# Patient Record
Sex: Female | Born: 1965 | Race: White | Hispanic: No | Marital: Married | Smoking: Former smoker
Health system: Southern US, Community
[De-identification: ages and names within clinical notes are randomized; demographics above are authoritative.]

## PROBLEM LIST (undated history)

## (undated) DIAGNOSIS — E785 Hyperlipidemia, unspecified: Secondary | ICD-10-CM

## (undated) DIAGNOSIS — F419 Anxiety disorder, unspecified: Secondary | ICD-10-CM

## (undated) DIAGNOSIS — K219 Gastro-esophageal reflux disease without esophagitis: Secondary | ICD-10-CM

## (undated) DIAGNOSIS — F329 Major depressive disorder, single episode, unspecified: Secondary | ICD-10-CM

## (undated) DIAGNOSIS — I10 Essential (primary) hypertension: Secondary | ICD-10-CM

## (undated) DIAGNOSIS — F32A Depression, unspecified: Secondary | ICD-10-CM

## (undated) DIAGNOSIS — I38 Endocarditis, valve unspecified: Secondary | ICD-10-CM

## (undated) DIAGNOSIS — Z800 Family history of malignant neoplasm of digestive organs: Secondary | ICD-10-CM

## (undated) DIAGNOSIS — Z803 Family history of malignant neoplasm of breast: Secondary | ICD-10-CM

## (undated) DIAGNOSIS — R011 Cardiac murmur, unspecified: Secondary | ICD-10-CM

## (undated) HISTORY — PX: BREAST EXCISIONAL BIOPSY: SUR124

## (undated) HISTORY — DX: Family history of malignant neoplasm of breast: Z80.3

## (undated) HISTORY — DX: Family history of malignant neoplasm of digestive organs: Z80.0

---
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
## 1984-02-04 HISTORY — PX: FRACTURE SURGERY: SHX138

## 1993-02-03 DIAGNOSIS — I38 Endocarditis, valve unspecified: Secondary | ICD-10-CM

## 1993-02-03 HISTORY — DX: Endocarditis, valve unspecified: I38

## 1997-08-23 ENCOUNTER — Ambulatory Visit (HOSPITAL_COMMUNITY): Admission: RE | Admit: 1997-08-23 | Discharge: 1997-08-23 | Payer: Self-pay | Admitting: Family Medicine

## 2016-07-18 ENCOUNTER — Other Ambulatory Visit: Payer: Self-pay | Admitting: Family Medicine

## 2016-07-18 DIAGNOSIS — Z1231 Encounter for screening mammogram for malignant neoplasm of breast: Secondary | ICD-10-CM

## 2016-08-12 ENCOUNTER — Ambulatory Visit
Admission: RE | Admit: 2016-08-12 | Discharge: 2016-08-12 | Disposition: A | Discharge status: Home / Self Care | Payer: BLUE CROSS/BLUE SHIELD | Source: Ambulatory Visit | Attending: Family Medicine | Admitting: Family Medicine

## 2016-08-12 DIAGNOSIS — Z1231 Encounter for screening mammogram for malignant neoplasm of breast: Secondary | ICD-10-CM

## 2016-08-13 ENCOUNTER — Inpatient Hospital Stay
Admission: RE | Admit: 2016-08-13 | Discharge: 2016-08-13 | Disposition: A | Discharge status: Home / Self Care | Payer: Self-pay | Source: Ambulatory Visit | Attending: *Deleted | Admitting: *Deleted

## 2016-08-13 ENCOUNTER — Other Ambulatory Visit: Payer: Self-pay | Admitting: Family Medicine

## 2016-08-13 ENCOUNTER — Other Ambulatory Visit: Payer: Self-pay | Admitting: *Deleted

## 2016-08-13 DIAGNOSIS — N644 Mastodynia: Secondary | ICD-10-CM

## 2016-08-13 DIAGNOSIS — Z9289 Personal history of other medical treatment: Secondary | ICD-10-CM

## 2016-09-15 ENCOUNTER — Ambulatory Visit
Admission: RE | Admit: 2016-09-15 | Discharge: 2016-09-15 | Disposition: A | Discharge status: Home / Self Care | Payer: BLUE CROSS/BLUE SHIELD | Source: Ambulatory Visit | Attending: Family Medicine | Admitting: Family Medicine

## 2016-09-15 ENCOUNTER — Encounter: Payer: Self-pay | Admitting: Radiology

## 2016-09-15 DIAGNOSIS — N644 Mastodynia: Secondary | ICD-10-CM | POA: Insufficient documentation

## 2016-09-15 DIAGNOSIS — R921 Mammographic calcification found on diagnostic imaging of breast: Secondary | ICD-10-CM | POA: Diagnosis not present

## 2017-07-01 ENCOUNTER — Encounter: Payer: Self-pay | Admitting: Internal Medicine

## 2017-07-08 ENCOUNTER — Ambulatory Visit: Payer: BLUE CROSS/BLUE SHIELD | Admitting: Gastroenterology

## 2017-07-08 ENCOUNTER — Encounter: Payer: Self-pay | Admitting: Gastroenterology

## 2017-07-08 VITALS — BP 95/61 | HR 77 | Ht 62.0 in | Wt 144.0 lb

## 2017-07-08 DIAGNOSIS — Z1211 Encounter for screening for malignant neoplasm of colon: Secondary | ICD-10-CM

## 2017-07-08 DIAGNOSIS — K5900 Constipation, unspecified: Secondary | ICD-10-CM

## 2017-07-08 DIAGNOSIS — R1310 Dysphagia, unspecified: Secondary | ICD-10-CM

## 2017-07-08 MED ORDER — PEG 3350-KCL-NA BICARB-NACL 420 G PO SOLR: 4000.0000 mL | Freq: Once | ORAL | 0 refills | Status: AC

## 2017-07-08 MED ORDER — BISACODYL 5 MG PO TBEC: 10.0000 mg | DELAYED_RELEASE_TABLET | Freq: Once | ORAL | 0 refills | Status: AC

## 2017-07-08 NOTE — Patient Instructions (Signed)
F/U 3 months Bed wedge Miralax daily  Indigestion Indigestion is a feeling of pain, discomfort, burning, or fullness in the upper part of your belly (abdomen). It can come and go. It may occur often or rarely. Indigestion tends to happen while you are eating or right after you have finished eating. It may be worse at night and while bending over or lying down. Follow these instructions at home: Take these actions to lessen your pain or discomfort and to help avoid problems. Diet  Follow a diet as told by your doctor. You may need to avoid foods and drinks such as: ? Coffee and tea (with or without caffeine). ? Drinks that contain alcohol. ? Energy drinks and sports drinks. ? Carbonated drinks or sodas. ? Chocolate and cocoa. ? Peppermint and mint flavorings. ? Garlic and onions. ? Horseradish. ? Spicy and acidic foods, such as peppers, chili powder, curry powder, vinegar, hot sauces, and BBQ sauce. ? Citrus fruit juices and citrus fruits, such as oranges, lemons, and limes. ? Tomato-based foods, such as red sauce, chili, salsa, and pizza with red sauce. ? Fried and fatty foods, such as donuts, french fries, potato chips, and high-fat dressings. ? High-fat meats, such as hot dogs, rib eye steak, sausage, ham, and bacon. ? High-fat dairy items, such as whole milk, butter, and cream cheese.  Eat small meals often. Avoid eating large meals.  Avoid drinking large amounts of liquid with your meals.  Avoid eating meals during the 2-3 hours before bedtime.  Avoid lying down right after you eat.  Do not exercise right after you eat. General instructions  Pay attention to any changes in your symptoms.  Take over-the-counter and prescription medicines only as told by your doctor. Do not take aspirin, ibuprofen, or other NSAIDs unless your doctor says it is okay.  Do not use any tobacco products, including cigarettes, chewing tobacco, and e-cigarettes. If you need help quitting, ask your  doctor.  Wear loose clothes. Do not wear anything tight around your waist.  Raise (elevate) the head of your bed about 6 inches (15 cm).  Try to lower your stress. If you need help doing this, ask your doctor.  If you are overweight, lose an amount of weight that is healthy for you. Ask your doctor about a safe weight loss goal.  Keep all follow-up visits as told by your doctor. This is important. Contact a doctor if:  You have new symptoms.  You lose weight and you do not know why it is happening.  You have trouble swallowing, or it hurts to swallow.  Your symptoms do not get better with treatment.  Your symptoms last for more than two days.  You have a fever.  You throw up (vomit). Get help right away if:  You have pain in your arms, neck, jaw, teeth, or back.  You feel sweaty, dizzy, or light-headed.  You pass out (faint).  You have chest pain or shortness of breath.  You cannot stop throwing up, or you throw up blood.  Your poop (stool) is bloody or black.  You have very bad pain in your belly. This information is not intended to replace advice given to you by your health care provider. Make sure you discuss any questions you have with your health care provider. Document Released: 02/22/2010 Document Revised: 06/28/2015 Document Reviewed: 05/17/2014 Elsevier Interactive Patient Education  2018 [place].  High-Fiber Diet Fiber, also called dietary fiber, is a type of carbohydrate found in fruits,  vegetables, whole grains, and beans. A high-fiber diet can have many health benefits. Your health care provider may recommend a high-fiber diet to help:  Prevent constipation. Fiber can make your bowel movements more regular.  Lower your cholesterol.  Relieve hemorrhoids, uncomplicated diverticulosis, or irritable bowel syndrome.  Prevent overeating as part of a weight-loss plan.  Prevent heart disease, type 2 diabetes, and certain cancers.  What is my  plan? The recommended daily intake of fiber includes:  38 grams for men under age 55.  22 grams for men over age 67.  5 grams for women under age 71.  58 grams for women over age 32.  You can get the recommended daily intake of dietary fiber by eating a variety of fruits, vegetables, grains, and beans. Your health care provider may also recommend a fiber supplement if it is not possible to get enough fiber through your diet. What do I need to know about a high-fiber diet?  Fiber supplements have not been widely studied for their effectiveness, so it is better to get fiber through food sources.  Always check the fiber content on thenutrition facts label of any prepackaged food. Look for foods that contain at least 5 grams of fiber per serving.  Ask your dietitian if you have questions about specific foods that are related to your condition, especially if those foods are not listed in the following section.  Increase your daily fiber consumption gradually. Increasing your intake of dietary fiber too quickly may cause bloating, cramping, or gas.  Drink plenty of water. Water helps you to digest fiber. What foods can I eat? Grains Whole-grain breads. Multigrain cereal. Oats and oatmeal. Brown rice. Barley. Bulgur wheat. [place]. Bran muffins. Popcorn. Rye wafer crackers. Vegetables Sweet potatoes. Spinach. Kale. Artichokes. Cabbage. Broccoli. Green peas. Carrots. Squash. Fruits Berries. Pears. Apples. Oranges. Avocados. Prunes and raisins. Dried figs. Meats and Other Protein Sources [place], kidney, pinto, and soy beans. Split peas. Lentils. Nuts and seeds. Dairy Fiber-fortified yogurt. Beverages Fiber-fortified soy milk. Fiber-fortified orange juice. Other Fiber bars. The items listed above may not be a complete list of recommended foods or beverages. Contact your dietitian for more options. What foods are not recommended? Grains White bread. Pasta made with refined flour. White  rice. Vegetables Fried potatoes. Canned vegetables. Well-cooked vegetables. Fruits Fruit juice. Cooked, strained fruit. Meats and Other Protein Sources Fatty cuts of meat. Fried Sales executive or fried fish. Dairy Milk. Yogurt. Cream cheese. Sour cream. Beverages Soft drinks. Other Cakes and pastries. Butter and oils. The items listed above may not be a complete list of foods and beverages to avoid. Contact your dietitian for more information. What are some tips for including high-fiber foods in my diet?  Eat a wide variety of high-fiber foods.  Make sure that half of all grains consumed each day are whole grains.  Replace breads and cereals made from refined flour or white flour with whole-grain breads and cereals.  Replace white rice with brown rice, bulgur wheat, or millet.  Start the day with a breakfast that is high in fiber, such as a cereal that contains at least 5 grams of fiber per serving.  Use beans in place of meat in soups, salads, or pasta.  Eat high-fiber snacks, such as berries, raw vegetables, nuts, or popcorn. This information is not intended to replace advice given to you by your health care provider. Make sure you discuss any questions you have with your health care provider. Document Released: 01/20/2005 Document Revised: 06/28/2015  Document Reviewed: 07/05/2013 Elsevier Interactive Patient Education  [employer].

## 2017-07-08 NOTE — Progress Notes (Signed)
Shelby Dunn [address]  [place]  [place], [place] [address]  Main: [phone]  Fax: [phone]   Gastroenterology Consultation  Referring Provider:     Eda Paschal, MD Primary Care Physician:  Shelby Paschal, MD Primary Gastroenterologist:  Dr. Vonda Dunn Reason for Consultation:     Odynophagia, constipation        HPI:    Chief Complaint  Patient presents with  . Establish Care    pressure in esophagus and air bubbles, change in bowel habits-stool softner not working,    Shelby Dunn is a 52 y.o. y/o female referred for consultation & management  by Dr. Eda Paschal, MD.  Patient reports intermittent odynophagia for the last 1 year, with worsening over the last 1 to 2 months.  Reports 2-3 episode a week with odynophagia to liquids and solids.  No dysphagia.  No weight loss.  No abdominal pain.  Reports intermittent heartburn as well.  Was recently seen by a cardiologist, as she reported chest pain, and was given a medication for indigestion, that she has not started yet.  States she has tried antacids, and omeprazole for 2 weeks over-the-counter for odynophagia and does not help.  She states she has a stress test scheduled on June 12 by the cardiologist.  Denies any chest pain at this time or since the cardiology visit.  Reports chronic constipation, goes 4 to 5 days without a bowel movement.  Uses stool softeners, and eats a high-fiber diet.  No blood in stool, no melena, no family history of colon cancer.  Past medical history: Hypertension   Prior to Admission medications   Medication Sig Start Date End Date Taking? Authorizing Provider  ALPRAZolam Duanne Moron) 1 MG tablet  06/22/17  Yes [provider]  amLODipine (NORVASC) 10 MG tablet  06/22/17  Yes [provider]  atorvastatin (LIPITOR) 40 MG tablet  06/22/17  Yes [provider]  buPROPion (WELLBUTRIN XL) 150 MG 24 hr tablet  06/22/17  Yes [provider]  lisinopril (PRINIVIL,ZESTRIL) 20 MG tablet  06/22/17  Yes [provider]  oxybutynin (DITROPAN-XL) 5 MG 24 hr tablet  06/22/17  Yes [provider]  traMADol-acetaminophen (ULTRACET) 37.5-325 MG tablet  06/22/17  Yes [provider]  zolpidem (AMBIEN) 10 MG tablet  06/22/17  Yes [provider]    Family History  Problem Relation Age of Onset  . Breast cancer Mother 59       double mastectomy  . Breast cancer Cousin 74       maternal     Social History   Tobacco Use  . Smoking status: Former Smoker    Last attempt to quit: 07/31/2012    Years since quitting: 4.9  . Smokeless tobacco: Never Used  Substance Use Topics  . Alcohol use: Never    Frequency: Never  . Drug use: Never    Allergies as of 07/08/2017  . (No Known Allergies)    Review of Systems:    All systems reviewed and negative except where noted in HPI.   Physical Exam:  BP 95/61   Pulse 77   Ht 5\' 2"  (1.575 m)   Wt 144 lb (65.3 kg)   BMI 26.34 kg/m  No LMP recorded. Patient is postmenopausal. Psych:  Alert and cooperative. Normal mood and affect. General:   Alert,  Well-developed, well-nourished, pleasant and cooperative in NAD Head:  Normocephalic and atraumatic. Eyes:  Sclera clear, no icterus.   Conjunctiva pink.  Ears:  Normal auditory acuity. Nose:  No deformity, discharge, or lesions. Mouth:  No deformity or lesions,oropharynx pink & moist. Neck:  Supple; no masses or thyromegaly. Lungs:  Respirations even and unlabored.  Clear throughout to auscultation.   No wheezes, crackles, or rhonchi. No acute distress. Heart:  Regular rate and rhythm; no murmurs, clicks, rubs, or gallops. Abdomen:  Normal bowel sounds.  No bruits.  Soft, non-tender and non-distended without masses, hepatosplenomegaly or hernias noted.  No guarding or rebound tenderness.    Msk:  Symmetrical without gross deformities. Good, equal movement & strength bilaterally. Pulses:  Normal pulses  noted. Extremities:  No clubbing or edema.  No cyanosis. Neurologic:  Alert and oriented x3;  grossly normal neurologically. Skin:  Intact without significant lesions or rashes. No jaundice. Lymph Nodes:  No significant cervical adenopathy. Psych:  Alert and cooperative. Normal mood and affect.   Labs: April 2019 labs show normal hemoglobin, normal liver enzymes  Imaging Studies: No results found.  Assessment and Plan:   Shelby Dunn is a 52 y.o. y/o female has been referred for intermittent odynophagia, and constipation  Patient will need evaluation with EGD for odynophagia, to rule out Candida esophagitis We will also need to evaluate for any strictures, narrowing, eosinophilic esophagitis Patient has a stress test scheduled with cardiologist next week.  If this is abnormal, I have asked the patient to call us, otherwise we will proceed with colonoscopy and EGD after the stress test.  She verbalized understanding.  Patient educated extensively on acid reflux lifestyle modification, including buying a bed wedge, not eating 3 hrs before bedtime, diet modifications, and handout given for the same.   If EGD is otherwise normal, can try trial of PPI at that time  Colonoscopy is indicated for average risk screening  For constipation, I have asked her to start eating high-fiber diet MiraLAX daily, and titrate dose to maintain 1-2 soft bowel movements daily.  Patient asked about the costs of these procedures, and how much she would owe her insurance.  I have asked her to personally call her insurance company, as they would be able to answer that question best for her.  Each insurance works differently, including [employer], and we have no control over this.  I have asked her to get this question answered by them directly, and we are unable to provide any input on this.  She verbalized understanding.  Dr Shelby Dunn

## 2017-07-22 ENCOUNTER — Encounter: Payer: Self-pay | Admitting: *Deleted

## 2017-07-23 ENCOUNTER — Ambulatory Visit: Payer: BLUE CROSS/BLUE SHIELD | Admitting: Registered Nurse

## 2017-07-23 ENCOUNTER — Other Ambulatory Visit: Payer: Self-pay

## 2017-07-23 ENCOUNTER — Ambulatory Visit
Admission: RE | Admit: 2017-07-23 | Discharge: 2017-07-23 | Disposition: A | Discharge status: Home / Self Care | Payer: BLUE CROSS/BLUE SHIELD | Source: Ambulatory Visit | Attending: Gastroenterology | Admitting: Gastroenterology

## 2017-07-23 ENCOUNTER — Encounter
Admission: RE | Disposition: A | Discharge status: Home / Self Care | Payer: Self-pay | Source: Ambulatory Visit | Attending: Gastroenterology

## 2017-07-23 DIAGNOSIS — K3189 Other diseases of stomach and duodenum: Secondary | ICD-10-CM

## 2017-07-23 DIAGNOSIS — Z1211 Encounter for screening for malignant neoplasm of colon: Secondary | ICD-10-CM | POA: Insufficient documentation

## 2017-07-23 DIAGNOSIS — R1310 Dysphagia, unspecified: Secondary | ICD-10-CM | POA: Diagnosis not present

## 2017-07-23 DIAGNOSIS — Z1210 Encounter for screening for malignant neoplasm of intestinal tract, unspecified: Secondary | ICD-10-CM | POA: Diagnosis not present

## 2017-07-23 DIAGNOSIS — Z87891 Personal history of nicotine dependence: Secondary | ICD-10-CM | POA: Insufficient documentation

## 2017-07-23 DIAGNOSIS — E785 Hyperlipidemia, unspecified: Secondary | ICD-10-CM | POA: Insufficient documentation

## 2017-07-23 DIAGNOSIS — F419 Anxiety disorder, unspecified: Secondary | ICD-10-CM | POA: Diagnosis not present

## 2017-07-23 DIAGNOSIS — K648 Other hemorrhoids: Secondary | ICD-10-CM | POA: Insufficient documentation

## 2017-07-23 DIAGNOSIS — F329 Major depressive disorder, single episode, unspecified: Secondary | ICD-10-CM | POA: Diagnosis not present

## 2017-07-23 DIAGNOSIS — Z79899 Other long term (current) drug therapy: Secondary | ICD-10-CM | POA: Insufficient documentation

## 2017-07-23 DIAGNOSIS — K2950 Unspecified chronic gastritis without bleeding: Secondary | ICD-10-CM | POA: Diagnosis not present

## 2017-07-23 DIAGNOSIS — K5730 Diverticulosis of large intestine without perforation or abscess without bleeding: Secondary | ICD-10-CM | POA: Diagnosis not present

## 2017-07-23 DIAGNOSIS — K635 Polyp of colon: Secondary | ICD-10-CM

## 2017-07-23 DIAGNOSIS — R1319 Other dysphagia: Secondary | ICD-10-CM

## 2017-07-23 DIAGNOSIS — D125 Benign neoplasm of sigmoid colon: Secondary | ICD-10-CM | POA: Diagnosis not present

## 2017-07-23 DIAGNOSIS — I10 Essential (primary) hypertension: Secondary | ICD-10-CM | POA: Diagnosis not present

## 2017-07-23 HISTORY — DX: Essential (primary) hypertension: I10

## 2017-07-23 HISTORY — DX: Cardiac murmur, unspecified: R01.1

## 2017-07-23 HISTORY — DX: Hyperlipidemia, unspecified: E78.5

## 2017-07-23 HISTORY — DX: Major depressive disorder, single episode, unspecified: F32.9

## 2017-07-23 HISTORY — PX: COLONOSCOPY WITH PROPOFOL: SHX5780

## 2017-07-23 HISTORY — PX: ESOPHAGOGASTRODUODENOSCOPY (EGD) WITH PROPOFOL: SHX5813

## 2017-07-23 HISTORY — DX: Anxiety disorder, unspecified: F41.9

## 2017-07-23 HISTORY — DX: Depression, unspecified: F32.A

## 2017-07-23 HISTORY — DX: Endocarditis, valve unspecified: I38

## 2017-07-23 SURGERY — COLONOSCOPY WITH PROPOFOL
Anesthesia: General

## 2017-07-23 MED ORDER — LIDOCAINE HCL (CARDIAC) PF 100 MG/5ML IV SOSY
PREFILLED_SYRINGE | INTRAVENOUS | Status: DC | PRN
  Administered 2017-07-23: 40 mg via INTRAVENOUS

## 2017-07-23 MED ORDER — EPHEDRINE SULFATE 50 MG/ML IJ SOLN
INTRAMUSCULAR | Status: AC
  Filled 2017-07-23: qty 1

## 2017-07-23 MED ORDER — GLYCOPYRROLATE 0.2 MG/ML IJ SOLN
INTRAMUSCULAR | Status: DC | PRN
  Administered 2017-07-23: 0.2 mg via INTRAVENOUS

## 2017-07-23 MED ORDER — LIDOCAINE HCL (PF) 2 % IJ SOLN
INTRAMUSCULAR | Status: AC
Start: 2017-07-23 — End: ?
  Filled 2017-07-23: qty 10

## 2017-07-23 MED ORDER — FENTANYL CITRATE (PF) 100 MCG/2ML IJ SOLN
INTRAMUSCULAR | Status: DC | PRN
  Administered 2017-07-23 (×3): 25 ug via INTRAVENOUS

## 2017-07-23 MED ORDER — PROPOFOL 500 MG/50ML IV EMUL
INTRAVENOUS | Status: AC
  Filled 2017-07-23: qty 50

## 2017-07-23 MED ORDER — FENTANYL CITRATE (PF) 100 MCG/2ML IJ SOLN
INTRAMUSCULAR | Status: AC
  Filled 2017-07-23: qty 2

## 2017-07-23 MED ORDER — PHENYLEPHRINE HCL 10 MG/ML IJ SOLN
INTRAMUSCULAR | Status: DC | PRN
  Administered 2017-07-23 (×4): 100 ug via INTRAVENOUS

## 2017-07-23 MED ORDER — PROPOFOL 500 MG/50ML IV EMUL
INTRAVENOUS | Status: DC | PRN
  Administered 2017-07-23: 140 ug/kg/min via INTRAVENOUS

## 2017-07-23 MED ORDER — GLYCOPYRROLATE 0.2 MG/ML IJ SOLN
INTRAMUSCULAR | Status: AC
  Filled 2017-07-23: qty 1

## 2017-07-23 MED ORDER — PROPOFOL 10 MG/ML IV BOLUS
INTRAVENOUS | Status: DC | PRN
  Administered 2017-07-23: 70 mg via INTRAVENOUS

## 2017-07-23 MED ORDER — SUCCINYLCHOLINE CHLORIDE 20 MG/ML IJ SOLN
INTRAMUSCULAR | Status: AC
  Filled 2017-07-23: qty 1

## 2017-07-23 MED ORDER — SODIUM CHLORIDE 0.9 % IV SOLN
INTRAVENOUS | Status: DC
  Administered 2017-07-23: 08:00:00 via INTRAVENOUS

## 2017-07-23 MED ORDER — LIDOCAINE HCL (PF) 2 % IJ SOLN
INTRAMUSCULAR | Status: AC
  Filled 2017-07-23: qty 10

## 2017-07-23 NOTE — Transfer of Care (Signed)
Immediate Anesthesia Transfer of Care Note  Patient: Samah Lapiana [facility], [place]  Procedure(s) Performed: COLONOSCOPY WITH PROPOFOL (N/A ) ESOPHAGOGASTRODUODENOSCOPY (EGD) WITH PROPOFOL (N/A )  Patient Location: PACU  Anesthesia Type:General  Level of Consciousness: awake, alert  and oriented  Airway & Oxygen Therapy: Patient Spontanous Breathing and Patient connected to nasal cannula oxygen  Post-op Assessment: Report given to RN and Post -op Vital signs reviewed and stable  Post vital signs: Reviewed and stable  Last Vitals:  Vitals Value Taken Time  BP 147/73 07/23/2017  9:01 AM  Temp 36.1 C 07/23/2017  9:00 AM  Pulse 88 07/23/2017  9:02 AM  Resp 20 07/23/2017  9:02 AM  SpO2 99 % 07/23/2017  9:02 AM  Vitals shown include unvalidated device data.  Last Pain:  Vitals:   07/23/17 0857  TempSrc: Tympanic  PainSc: 0-No pain         Complications: No apparent anesthesia complications

## 2017-07-23 NOTE — Anesthesia Postprocedure Evaluation (Signed)
Anesthesia Post Note  [facility]: [facility]  Procedure(s) Performed: COLONOSCOPY WITH PROPOFOL (N/A ) ESOPHAGOGASTRODUODENOSCOPY (EGD) WITH PROPOFOL (N/A )  Patient location during evaluation: Endoscopy Anesthesia Type: General Level of consciousness: awake and alert Pain management: pain level controlled Vital Signs Assessment: post-procedure vital signs reviewed and stable Respiratory status: spontaneous breathing, nonlabored ventilation, respiratory function stable and patient connected to nasal cannula oxygen Cardiovascular status: blood pressure returned to baseline and stable Postop Assessment: no apparent nausea or vomiting Anesthetic complications: no     Last Vitals:  Vitals:   07/23/17 0900 07/23/17 0921  BP: (!) 147/73 120/65  Pulse: 90   Resp: 17   Temp: (!) 36.1 C   SpO2: 100%     Last Pain:  Vitals:   07/23/17 0921  TempSrc:   PainSc: 0-No pain                 Precious Haws Piscitello
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
## 2017-07-23 NOTE — Op Note (Signed)
[facility] Gastroenterology Patient Name: Shelby Dunn Procedure Date: 07/23/2017 8:03 AM MRN: [id number] Account #: [id number] Date of Birth: 1965/12/01 Admit Type: Outpatient Age: 52 Room: [facility] ENDO ROOM 1 Gender: Female Note Status: Finalized Procedure:            Upper GI endoscopy Indications:          Dysphagia, Odynophagia Providers:            Jesse Nosbisch B. Bonna Gains MD, MD Referring MD:         Jordan Likes. Lavena Bullion (Referring MD) Medicines:            Monitored Anesthesia Care Complications:        No immediate complications. Procedure:            Pre-Anesthesia Assessment:                       - Prior to the procedure, a History and Physical was                        performed, and patient medications, allergies and                        sensitivities were reviewed. The patient's tolerance of                        previous anesthesia was reviewed.                       - The risks and benefits of the procedure and the                        sedation options and risks were discussed with the                        patient. All questions were answered and informed                        consent was obtained.                       - Patient identification and proposed procedure were                        verified prior to the procedure by the physician, the                        nurse, the anesthesiologist, the anesthetist and the                        technician. The procedure was verified in the procedure                        room.                       - ASA Grade Assessment: III - A patient with severe                        systemic disease.                       After  obtaining informed consent, the endoscope was                        passed under direct vision. Throughout the procedure,                        the patient's blood pressure, pulse, and oxygen                        saturations were monitored continuously. The Endoscope               was introduced through the mouth, and advanced to the                        second part of duodenum. The upper GI endoscopy was                        accomplished with ease. The patient tolerated the                        procedure well. Findings:      The examined esophagus was normal. Biopsies were obtained from the       proximal and distal esophagus with cold forceps for histology of       suspected eosinophilic esophagitis.      The Z-line was regular and was found 37 cm from the incisors.      Patchy mildly erythematous mucosa without bleeding was found in the       gastric antrum. Biopsies were taken with a cold forceps for histology.       Biopsies were obtained in the gastric body, at the incisura and in the       gastric antrum with cold forceps for histology.      Localized mucosal changes were found in the duodenal bulb. Biopsies were       taken with a cold forceps for histology. This likely represents       pancreatic rest      The duodenal bulb, second portion of the duodenum and examined duodenum       were normal. Impression:           - Normal esophagus. Biopsied.                       - Z-line regular, 37 cm from the incisors.                       - Erythematous mucosa in the antrum. Biopsied.                       - Mucosal changes in the duodenum. Biopsied. Possible                        pancreatic rest.                       - Normal duodenal bulb, second portion of the duodenum                        and examined duodenum.                       -  Biopsies were obtained in the gastric body, at the                        incisura and in the gastric antrum. Recommendation:       - Await pathology results.                       - Discharge patient to home (with escort).                       - Advance diet as tolerated.                       - Continue present medications.                       - Patient has a contact number available for                         emergencies. The signs and symptoms of potential                        delayed complications were discussed with the patient.                        Return to normal activities tomorrow. Written discharge                        instructions were provided to the patient.                       - Discharge patient to home (with escort).                       - The findings and recommendations were discussed with                        the patient.                       - The findings and recommendations were discussed with                        the patient's family. Procedure Code(s):    --- Professional ---                       [phone], Esophagogastroduodenoscopy, flexible, transoral;                        with biopsy, single or multiple Diagnosis Code(s):    --- Professional ---                       K31.89, Other diseases of stomach and duodenum                       R13.10, Dysphagia, unspecified CPT copyright 2017 American Medical Association. All rights reserved. The codes documented in this report are preliminary and upon coder review may  be revised to meet current compliance requirements.  Vonda Antigua, MD Margretta Sidle B. Bonna Gains MD, MD 07/23/2017 8:25:16 AM This report has been signed electronically. Number of Addenda: 0 Note Initiated On:  07/23/2017 8:03 AM Estimated Blood Loss: Estimated blood loss: none.      [facility]

## 2017-07-23 NOTE — Op Note (Signed)
[facility] Patient Name: Shelby Dunn Procedure Date: 07/23/2017 8:02 AM MRN: [id number] Account #: [id number] Date of Birth: 11/02/1965 Admit Type: Outpatient Age: 52 Room: [facility] ENDO ROOM 1 Gender: Female Note Status: Finalized Procedure:            Colonoscopy Indications:          Screening for colorectal malignant neoplasm Providers:            Airel Magadan B. Bonna Gains MD, MD Referring MD:         Jordan Likes. Lavena Bullion (Referring MD) Medicines:            Monitored Anesthesia Care Complications:        No immediate complications. Procedure:            Pre-Anesthesia Assessment:                       - ASA Grade Assessment: III - A patient with severe                        systemic disease.                       - Prior to the procedure, a History and Physical was                        performed, and patient medications, allergies and                        sensitivities were reviewed. The patient's tolerance of                        previous anesthesia was reviewed.                       - The risks and benefits of the procedure and the                        sedation options and risks were discussed with the                        patient. All questions were answered and informed                        consent was obtained.                       - Patient identification and proposed procedure were                        verified prior to the procedure by the physician, the                        nurse, the anesthesiologist, the anesthetist and the                        technician. The procedure was verified in the procedure                        room.  After obtaining informed consent, the colonoscope was                        passed under direct vision. Throughout the procedure,                        the patient's blood pressure, pulse, and oxygen                        saturations were monitored continuously. The                     Colonoscope was introduced through the anus and                        advanced to the the cecum, identified by appendiceal                        orifice and ileocecal valve. The colonoscopy was                        performed with ease. The patient tolerated the                        procedure well. The quality of the bowel preparation                        was good. Findings:      The perianal and digital rectal examinations were normal.      Three sessile polyps were found in the sigmoid colon. The polyps were 2       to 4 mm in size. These polyps were removed with a cold biopsy forceps.       Resection and retrieval were complete.      Multiple diverticula were found in the sigmoid colon.      The exam was otherwise without abnormality.      The rectum, sigmoid colon, descending colon, transverse colon, ascending       colon and cecum appeared normal.      Non-bleeding internal hemorrhoids were found during retroflexion. Impression:           - Three 2 to 4 mm polyps in the sigmoid colon, removed                        with a cold biopsy forceps. Resected and retrieved.                       - Diverticulosis in the sigmoid colon.                       - The examination was otherwise normal.                       - The rectum, sigmoid colon, descending colon,                        transverse colon, ascending colon and cecum are normal.                       - Non-bleeding internal hemorrhoids. Recommendation:       - Discharge patient to  home (with escort).                       - Advance diet as tolerated.                       - Continue present medications.                       - Await pathology results.                       - The findings and recommendations were discussed with                        the patient.                       - The findings and recommendations were discussed with                        the patient's family.                        - Return to primary care physician as previously                        scheduled.                       - High fiber diet.                       - If the pathology report reveals adenomatous tissue,                        then repeat the colonoscopy for surveillance in 5 years.                       - If the pathology report indicates hyperplastic polyp,                        then repeat colonoscopy for screening purposes in 10                        years. Procedure Code(s):    --- Professional ---                       [phone], Colonoscopy, flexible; with biopsy, single or                        multiple Diagnosis Code(s):    --- Professional ---                       Z12.11, Encounter for screening for malignant neoplasm                        of colon                       D12.5, Benign neoplasm of sigmoid colon                       K64.8, Other hemorrhoids  K57.30, Diverticulosis of large intestine without                        perforation or abscess without bleeding CPT copyright 2017 American Medical Association. All rights reserved. The codes documented in this report are preliminary and upon coder review may  be revised to meet current compliance requirements.  Vonda Antigua, MD Margretta Sidle B. Bonna Gains MD, MD 07/23/2017 8:58:33 AM This report has been signed electronically. Number of Addenda: 0 Note Initiated On: 07/23/2017 8:02 AM Scope Withdrawal Time: 0 hours 19 minutes 11 seconds  Total Procedure Duration: 0 hours 25 minutes 55 seconds  Estimated Blood Loss: Estimated blood loss: none.      [facility]
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
## 2017-07-23 NOTE — Anesthesia Post-op Follow-up Note (Signed)
Anesthesia QCDR form completed.        

## 2017-07-27 LAB — SURGICAL PATHOLOGY

## 2017-09-23 ENCOUNTER — Ambulatory Visit
Admission: RE | Admit: 2017-09-23 | Discharge: 2017-09-23 | Disposition: A | Discharge status: Home / Self Care | Payer: BLUE CROSS/BLUE SHIELD | Source: Ambulatory Visit | Attending: Family Medicine | Admitting: Family Medicine

## 2017-09-23 ENCOUNTER — Other Ambulatory Visit: Payer: Self-pay | Admitting: Family Medicine

## 2017-09-23 DIAGNOSIS — M25512 Pain in left shoulder: Secondary | ICD-10-CM

## 2017-10-19 ENCOUNTER — Ambulatory Visit: Payer: BLUE CROSS/BLUE SHIELD | Admitting: Gastroenterology

## 2019-04-20 ENCOUNTER — Other Ambulatory Visit: Payer: Self-pay | Admitting: Family Medicine

## 2019-04-20 DIAGNOSIS — Z1231 Encounter for screening mammogram for malignant neoplasm of breast: Secondary | ICD-10-CM

## 2019-05-17 ENCOUNTER — Ambulatory Visit
Admission: RE | Admit: 2019-05-17 | Discharge: 2019-05-17 | Disposition: A | Discharge status: Home / Self Care | Payer: 59 | Source: Ambulatory Visit | Attending: Family Medicine | Admitting: Family Medicine

## 2019-05-17 DIAGNOSIS — Z1231 Encounter for screening mammogram for malignant neoplasm of breast: Secondary | ICD-10-CM | POA: Diagnosis present

## 2019-05-23 ENCOUNTER — Other Ambulatory Visit: Payer: Self-pay | Admitting: Family Medicine

## 2019-05-23 DIAGNOSIS — R928 Other abnormal and inconclusive findings on diagnostic imaging of breast: Secondary | ICD-10-CM

## 2019-05-23 DIAGNOSIS — N6320 Unspecified lump in the left breast, unspecified quadrant: Secondary | ICD-10-CM

## 2019-06-02 ENCOUNTER — Ambulatory Visit
Admission: RE | Admit: 2019-06-02 | Discharge: 2019-06-02 | Disposition: A | Discharge status: Home / Self Care | Payer: 59 | Source: Ambulatory Visit | Attending: Family Medicine | Admitting: Family Medicine

## 2019-06-02 DIAGNOSIS — R928 Other abnormal and inconclusive findings on diagnostic imaging of breast: Secondary | ICD-10-CM

## 2019-06-02 DIAGNOSIS — N6320 Unspecified lump in the left breast, unspecified quadrant: Secondary | ICD-10-CM

## 2019-06-15 ENCOUNTER — Other Ambulatory Visit: Payer: Self-pay | Admitting: Family Medicine

## 2019-06-15 DIAGNOSIS — N6320 Unspecified lump in the left breast, unspecified quadrant: Secondary | ICD-10-CM

## 2019-12-06 ENCOUNTER — Other Ambulatory Visit: Payer: Self-pay

## 2019-12-06 ENCOUNTER — Ambulatory Visit
Admission: RE | Admit: 2019-12-06 | Discharge: 2019-12-06 | Disposition: A | Discharge status: Home / Self Care | Payer: 59 | Source: Ambulatory Visit | Attending: Family Medicine | Admitting: Family Medicine

## 2019-12-06 DIAGNOSIS — N6320 Unspecified lump in the left breast, unspecified quadrant: Secondary | ICD-10-CM | POA: Diagnosis present

## 2019-12-12 ENCOUNTER — Other Ambulatory Visit: Payer: Self-pay | Admitting: Family Medicine

## 2019-12-12 DIAGNOSIS — R928 Other abnormal and inconclusive findings on diagnostic imaging of breast: Secondary | ICD-10-CM

## 2019-12-12 DIAGNOSIS — N6320 Unspecified lump in the left breast, unspecified quadrant: Secondary | ICD-10-CM
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
## 2020-01-03 ENCOUNTER — Ambulatory Visit (INDEPENDENT_AMBULATORY_CARE_PROVIDER_SITE_OTHER): Payer: 59 | Admitting: Obstetrics and Gynecology

## 2020-01-03 ENCOUNTER — Other Ambulatory Visit: Payer: Self-pay

## 2020-01-03 ENCOUNTER — Other Ambulatory Visit (HOSPITAL_COMMUNITY)
Admission: RE | Admit: 2020-01-03 | Discharge: 2020-01-03 | Disposition: A | Discharge status: Home / Self Care | Payer: 59 | Source: Ambulatory Visit | Attending: Obstetrics and Gynecology | Admitting: Obstetrics and Gynecology

## 2020-01-03 ENCOUNTER — Encounter: Payer: Self-pay | Admitting: Obstetrics and Gynecology

## 2020-01-03 VITALS — BP 110/60 | Ht 62.0 in | Wt 148.0 lb

## 2020-01-03 DIAGNOSIS — Z124 Encounter for screening for malignant neoplasm of cervix: Secondary | ICD-10-CM | POA: Insufficient documentation

## 2020-01-03 DIAGNOSIS — Z1151 Encounter for screening for human papillomavirus (HPV): Secondary | ICD-10-CM | POA: Insufficient documentation

## 2020-01-03 DIAGNOSIS — R928 Other abnormal and inconclusive findings on diagnostic imaging of breast: Secondary | ICD-10-CM

## 2020-01-03 DIAGNOSIS — Z01419 Encounter for gynecological examination (general) (routine) without abnormal findings: Secondary | ICD-10-CM | POA: Diagnosis not present

## 2020-01-03 DIAGNOSIS — Z803 Family history of malignant neoplasm of breast: Secondary | ICD-10-CM

## 2020-01-03 DIAGNOSIS — Z1231 Encounter for screening mammogram for malignant neoplasm of breast: Secondary | ICD-10-CM

## 2020-01-03 NOTE — Patient Instructions (Signed)
I value your feedback and entrusting us with your care. If you get a [facility] patient survey, I would appreciate you taking the time to let us know about your experience today. Thank you! ° °As of January 13, 2019, your lab results will be released to your MyChart immediately, before I even have a chance to see them. Please give me time to review them and contact you if there are any abnormalities. Thank you for your patience.  ° °[facility]: [phone] ° ° ° °

## 2020-01-05 LAB — CYTOLOGY - PAP
Comment: NEGATIVE
Diagnosis: NEGATIVE
High risk HPV: NEGATIVE

## 2020-05-18 ENCOUNTER — Other Ambulatory Visit: Payer: Self-pay

## 2020-05-18 ENCOUNTER — Ambulatory Visit
Admission: RE | Admit: 2020-05-18 | Discharge: 2020-05-18 | Disposition: A | Discharge status: Home / Self Care | Payer: 59 | Source: Ambulatory Visit | Attending: Family Medicine | Admitting: Family Medicine

## 2020-05-18 DIAGNOSIS — R928 Other abnormal and inconclusive findings on diagnostic imaging of breast: Secondary | ICD-10-CM

## 2020-05-18 DIAGNOSIS — N6320 Unspecified lump in the left breast, unspecified quadrant: Secondary | ICD-10-CM

## 2020-05-22 ENCOUNTER — Other Ambulatory Visit: Payer: Self-pay | Admitting: Family Medicine

## 2020-05-22 DIAGNOSIS — N6320 Unspecified lump in the left breast, unspecified quadrant: Secondary | ICD-10-CM

## 2020-05-22 DIAGNOSIS — R928 Other abnormal and inconclusive findings on diagnostic imaging of breast: Secondary | ICD-10-CM

## 2020-05-29 ENCOUNTER — Ambulatory Visit
Admission: RE | Admit: 2020-05-29 | Discharge: 2020-05-29 | Disposition: A | Discharge status: Home / Self Care | Payer: 59 | Source: Ambulatory Visit | Attending: Family Medicine | Admitting: Family Medicine

## 2020-05-29 ENCOUNTER — Other Ambulatory Visit: Payer: Self-pay

## 2020-05-29 DIAGNOSIS — R928 Other abnormal and inconclusive findings on diagnostic imaging of breast: Secondary | ICD-10-CM | POA: Diagnosis not present

## 2020-05-29 DIAGNOSIS — N6320 Unspecified lump in the left breast, unspecified quadrant: Secondary | ICD-10-CM | POA: Insufficient documentation

## 2020-05-31 ENCOUNTER — Encounter: Payer: Self-pay | Admitting: *Deleted

## 2020-05-31 LAB — SURGICAL PATHOLOGY

## 2020-05-31 NOTE — Progress Notes (Signed)
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  Patient would like to see Dr. Bary Castilla.  I have scheduled her to see him on 06/07/20 at 10:45.  She was encouraged to call back with any questions or needs.

## 2020-06-01 ENCOUNTER — Encounter: Payer: Self-pay | Admitting: *Deleted

## 2020-06-01 HISTORY — PX: BREAST BIOPSY: SHX20

## 2020-06-01 NOTE — Progress Notes (Signed)
Patient called and stated her insurance does not cover [facility] and will to change surgeons.  I have her scheduled her to see Dr. Christian Mate on 06/05/20 @ 10:15.  Will cancel her other appointment on Monday.  Their office is closed this afternoon.

## 2020-06-05 ENCOUNTER — Telehealth: Payer: Self-pay | Admitting: Surgery

## 2020-06-05 ENCOUNTER — Other Ambulatory Visit: Payer: Self-pay

## 2020-06-05 ENCOUNTER — Ambulatory Visit: Payer: Self-pay | Admitting: Surgery

## 2020-06-05 ENCOUNTER — Other Ambulatory Visit: Payer: Self-pay | Admitting: Surgery

## 2020-06-05 ENCOUNTER — Encounter: Payer: Self-pay | Admitting: Surgery

## 2020-06-05 ENCOUNTER — Ambulatory Visit (INDEPENDENT_AMBULATORY_CARE_PROVIDER_SITE_OTHER): Payer: 59 | Admitting: Surgery

## 2020-06-05 VITALS — BP 147/69 | HR 68 | Temp 98.3°F | Ht 61.0 in | Wt 144.0 lb

## 2020-06-05 DIAGNOSIS — N6092 Unspecified benign mammary dysplasia of left breast: Secondary | ICD-10-CM | POA: Insufficient documentation

## 2020-06-05 DIAGNOSIS — D242 Benign neoplasm of left breast: Secondary | ICD-10-CM

## 2020-06-05 NOTE — Telephone Encounter (Signed)
Outgoing call is made, left message for patient to call.  Please patient of Pre-Admission date/time, COVID Testing date and Surgery date.  Surgery Date: 06/18/20 Preadmission Testing Date: 06/12/20 (phone 8a-1p) Covid Testing Date: 06/14/20 @ 9:20 am, patient advised to go to the [facility] ([place])   Also patient to call at [phone], between 1-3:00pm the day before surgery, to find out what time to arrive for surgery.
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
## 2020-06-05 NOTE — Telephone Encounter (Signed)
Incoming call from the patient.  She is informed of all dates regarding her surgery and verbalized understanding.
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
## 2020-06-05 NOTE — Patient Instructions (Addendum)
We have spoken today about removing a lump in your breast. This will be done by Dr. Christian Mate at [facility].  You will most likely be able to leave the hospital several hours after your surgery. Rarely, a patient needs to stay over night but this is a possibility.  Plan to tenatively be off work for 1 week following the surgery and may return with approximately 2 more weeks of a lifting restriction, no greater than 15 lbs.  See your [facility] surgery sheet for information. Our surgery scheduler will call you to go over dates and surgery information.     Lumpectomy A lumpectomy is a form of "breast conserving" or "breast preservation" surgery. It may also be referred to as a partial mastectomy. During a lumpectomy, the portion of the breast that contains the cancerous tumor or breast mass (the lump) is removed. Some normal tissue around the lump may also be removed to make sure all of the tumor has been removed.  LET [facility] CARE PROVIDER KNOW ABOUT:  Any allergies you have.  All medicines you are taking, including vitamins, herbs, eye drops, creams, and over-the-counter medicines.  Previous problems you or members of your family have had with the use of anesthetics.  Any blood disorders you have.  Previous surgeries you have had.  Medical conditions you have. RISKS AND COMPLICATIONS Generally, this is a safe procedure. However, problems can occur and include:  Bleeding.  Infection.  Pain.  Temporary swelling.  Change in the shape of the breast, particularly if a large portion is removed. BEFORE THE PROCEDURE  Ask your health care provider about changing or stopping your regular medicines. This is especially important if you are taking diabetes medicines or blood thinners.  Do not eat or drink anything after midnight on the night before the procedure or as directed by your health care provider. Ask your health care provider if you can take a sip of water with any approved  medicines.  On the day of surgery, your health care provider will use a mammogram or ultrasound to locate and mark the tumor in your breast. These markings on your breast will show where the cut (incision) will be made. PROCEDURE   An IV tube will be put into one of your veins.  You may be given medicine to help you relax before the surgery (sedative). You will be given one of the following:  A medicine that numbs the area (local anesthetic).  A medicine that makes you fall asleep (general anesthetic).  Your health care provider will use a kind of electric scalpel that uses heat to minimize bleeding (electrocautery knife).  A curved incision (like a smile or frown) that follows the natural curve of your breast is made, to allow for minimal scarring and better healing.  The tumor will be removed with some of the surrounding tissue. This will be sent to the lab for analysis. Your health care provider may also remove your lymph nodes at this time if needed.  Sometimes, but not always, a rubber tube called a drain will be surgically inserted into your breast area or armpit to collect excess fluid that may accumulate in the space where the tumor was. This drain is connected to a plastic bulb on the outside of your body. This drain creates suction to help remove the fluid.  The incisions will be closed with stitches (sutures).  A bandage may be placed over the incisions. AFTER THE PROCEDURE  You will be taken to the  recovery area.  You will be given medicine for pain.  A small rubber drain may be placed in the breast for 2-3 days to prevent a collection of blood (hematoma) from developing in the breast. You will be given instructions on caring for the drain before you go home.  A pressure bandage (dressing) will be applied for 1-2 days to prevent bleeding. Ask your health care provider how to care for your bandage at home.   This information is not intended to replace advice given to you  by your health care provider. Make sure you discuss any questions you have with your health care provider.   Document Released: 03/03/2006 Document Revised: 02/10/2014 Document Reviewed: 06/25/2012 Elsevier Interactive Patient Education [employer].

## 2020-06-11 ENCOUNTER — Other Ambulatory Visit: Payer: Self-pay

## 2020-06-11 ENCOUNTER — Ambulatory Visit
Admission: RE | Admit: 2020-06-11 | Discharge: 2020-06-11 | Disposition: A | Discharge status: Home / Self Care | Payer: 59 | Source: Ambulatory Visit | Attending: Surgery | Admitting: Surgery

## 2020-06-11 DIAGNOSIS — D242 Benign neoplasm of left breast: Secondary | ICD-10-CM | POA: Insufficient documentation

## 2020-06-12 ENCOUNTER — Encounter
Admission: RE | Admit: 2020-06-12 | Discharge: 2020-06-12 | Disposition: A | Discharge status: Home / Self Care | Payer: 59 | Source: Ambulatory Visit | Attending: Surgery | Admitting: Surgery

## 2020-06-12 ENCOUNTER — Other Ambulatory Visit: Payer: 59

## 2020-06-12 HISTORY — DX: Gastro-esophageal reflux disease without esophagitis: K21.9

## 2020-06-12 NOTE — Patient Instructions (Signed)
Your procedure is scheduled on: 06/18/20 Report to [facility]. To find out your arrival time please call [phone] between 1PM - 3PM on 06/15/20.  Remember: Instructions that are not followed completely may result in serious medical risk, up to and including death, or upon the discretion of your surgeon and anesthesiologist your surgery may need to be rescheduled.     _X__ 1. Do not eat food or drink liquids after midnight the night before your procedure.                   __X__2.  On the morning of surgery brush your teeth with toothpaste and water, you                 may rinse your mouth with mouthwash if you wish.  Do not swallow any              toothpaste of mouthwash.     _X__ 3.  No Alcohol for 24 hours before or after surgery.   _X__ 4.  Do Not Smoke or use e-cigarettes For 24 Hours Prior to Your Surgery.                 Do not use any chewable tobacco products for at least 6 hours prior to                 surgery.  ____  5.  Bring all medications with you on the day of surgery if instructed.   __X__  6.  Notify your doctor if there is any change in your medical condition      (cold, fever, infections).     Do not wear jewelry, make-up, hairpins, clips or nail polish. Do not wear lotions, powders, or perfumes deodofrant.  Do not shave 48 hours prior to surgery. Men may shave face and neck. Do not bring valuables to the hospital.    [facility] is not responsible for any belongings or valuables.  Contacts, dentures/partials or body piercings may not be worn into surgery. Bring a case for your contacts, glasses or hearing aids, a denture cup will be supplied. Leave your suitcase in the car. After surgery it may be brought to your room. For patients admitted to the hospital, discharge time is determined by your treatment team.   Patients discharged the day of surgery will not be allowed to drive home.   Please read  over the following fact sheets that you were given:   CHG soap  __X__ Take these medicines the morning of surgery with A SIP OF WATER:    1. FLUoxetine (PROZAC) 20 MG capsule  2. levothyroxine (SYNTHROID) 50 MCG tablet  3. omeprazole (PRILOSEC) 20 MG capsule  4. ALPRAZolam (XANAX) 1 MG tablet if needed  5. traMADol-acetaminophen (ULTRACET) 37.5-325 MG tablet if needed  6.   ____ Fleet Enema (as directed)   __X__ Use CHG Soap/SAGE wipes as directed  ____ Use inhalers on the day of surgery  ____ Stop metformin/Janumet/Farxiga 2 days prior to surgery    ____ Take 1/2 of usual insulin dose the night before surgery. No insulin the morning          of surgery.   ____ Stop Blood Thinners Coumadin/Plavix/Xarelto/Pleta/Pradaxa/Eliquis/Effient/Aspirin  on   Or contact your Surgeon, Cardiologist or Medical Doctor regarding  ability to stop your blood thinners  __X__ Stop Anti-inflammatories 7 days before surgery such as Advil, Ibuprofen, Motrin,  BC or Goodies Powder, Naprosyn, Naproxen, Aleve, Aspirin   STOP MELOXICAM TODAY 06/12/20  __X__ Stop all herbal supplements, fish oil or vitamin E until after surgery.    ____ Bring C-Pap to the hospital.

## 2020-06-13 ENCOUNTER — Encounter
Admission: RE | Admit: 2020-06-13 | Discharge: 2020-06-13 | Disposition: A | Discharge status: Home / Self Care | Payer: 59 | Source: Ambulatory Visit | Attending: Surgery | Admitting: Surgery

## 2020-06-13 ENCOUNTER — Telehealth: Payer: Self-pay | Admitting: Surgery

## 2020-06-13 ENCOUNTER — Other Ambulatory Visit: Payer: Self-pay

## 2020-06-13 DIAGNOSIS — Z01818 Encounter for other preprocedural examination: Secondary | ICD-10-CM | POA: Diagnosis not present

## 2020-06-13 DIAGNOSIS — Z01810 Encounter for preprocedural cardiovascular examination: Secondary | ICD-10-CM | POA: Diagnosis not present

## 2020-06-13 NOTE — Telephone Encounter (Signed)
Shelby Dunn. [facility] Utilization Management Review Dept called advising the Peer-to-Peer review has been completed & the determination is as follows:  CPT [phone] (RF Tag Placement) has been approved w/no auth required CPT 44818 (Partial Mastectomy) has been denied  Should we wish to appeal this decision, feel free to contact the [place] @ [phone].  A secure chat has been sent to Dr. Christian Mate advising of this determination & requesting assistance obtaining the appeal when he returns to clinic tomorrow (06/14/20).

## 2020-06-14 ENCOUNTER — Other Ambulatory Visit: Payer: 59

## 2020-06-18 ENCOUNTER — Other Ambulatory Visit: Payer: Self-pay

## 2020-06-18 ENCOUNTER — Encounter
Admission: RE | Disposition: A | Discharge status: Home / Self Care | Payer: Self-pay | Source: Home / Self Care | Attending: Surgery

## 2020-06-18 ENCOUNTER — Encounter: Payer: Self-pay | Admitting: Surgery

## 2020-06-18 ENCOUNTER — Ambulatory Visit
Admission: RE | Admit: 2020-06-18 | Discharge: 2020-06-18 | Disposition: A | Discharge status: Home / Self Care | Payer: 59 | Source: Ambulatory Visit | Attending: Surgery | Admitting: Surgery

## 2020-06-18 ENCOUNTER — Ambulatory Visit: Payer: 59 | Admitting: Anesthesiology

## 2020-06-18 ENCOUNTER — Ambulatory Visit
Admission: RE | Admit: 2020-06-18 | Discharge: 2020-06-18 | Disposition: A | Discharge status: Home / Self Care | Payer: 59 | Attending: Surgery | Admitting: Surgery

## 2020-06-18 DIAGNOSIS — D242 Benign neoplasm of left breast: Secondary | ICD-10-CM

## 2020-06-18 DIAGNOSIS — Z87891 Personal history of nicotine dependence: Secondary | ICD-10-CM | POA: Insufficient documentation

## 2020-06-18 DIAGNOSIS — N6092 Unspecified benign mammary dysplasia of left breast: Secondary | ICD-10-CM | POA: Diagnosis present

## 2020-06-18 DIAGNOSIS — Z79890 Hormone replacement therapy (postmenopausal): Secondary | ICD-10-CM | POA: Diagnosis not present

## 2020-06-18 DIAGNOSIS — Z803 Family history of malignant neoplasm of breast: Secondary | ICD-10-CM | POA: Insufficient documentation

## 2020-06-18 DIAGNOSIS — D0512 Intraductal carcinoma in situ of left breast: Secondary | ICD-10-CM | POA: Diagnosis not present

## 2020-06-18 DIAGNOSIS — Z79899 Other long term (current) drug therapy: Secondary | ICD-10-CM | POA: Insufficient documentation

## 2020-06-18 HISTORY — PX: BREAST LUMPECTOMY WITH RADIOFREQUENCY TAG IDENTIFICATION: SHX6884

## 2020-06-18 SURGERY — BREAST LUMPECTOMY WITH RADIOFREQUENCY TAG IDENTIFICATION
Anesthesia: General | Laterality: Left

## 2020-06-18 MED ORDER — BUPIVACAINE LIPOSOME 1.3 % IJ SUSP
INTRAMUSCULAR | Status: AC
  Filled 2020-06-18: qty 20

## 2020-06-18 MED ORDER — BUPIVACAINE-EPINEPHRINE (PF) 0.25% -1:200000 IJ SOLN
INTRAMUSCULAR | Status: DC | PRN
  Administered 2020-06-18: 30 mL

## 2020-06-18 MED ORDER — CHLORHEXIDINE GLUCONATE CLOTH 2 % EX PADS: 6.0000 | MEDICATED_PAD | Freq: Once | CUTANEOUS | Status: DC

## 2020-06-18 MED ORDER — PROPOFOL 10 MG/ML IV BOLUS
INTRAVENOUS | Status: AC
  Filled 2020-06-18: qty 20

## 2020-06-18 MED ORDER — ONDANSETRON HCL 4 MG/2ML IJ SOLN
INTRAMUSCULAR | Status: DC | PRN
  Administered 2020-06-18: 4 mg via INTRAVENOUS

## 2020-06-18 MED ORDER — FENTANYL CITRATE (PF) 100 MCG/2ML IJ SOLN
25.0000 ug | INTRAMUSCULAR | Status: DC | PRN
Start: 2020-06-18 — End: 2020-06-18
  Administered 2020-06-18 (×3): 25 ug via INTRAVENOUS

## 2020-06-18 MED ORDER — ONDANSETRON HCL 4 MG/2ML IJ SOLN: 4.0000 mg | Freq: Once | INTRAMUSCULAR | Status: DC | PRN

## 2020-06-18 MED ORDER — LIDOCAINE HCL (CARDIAC) PF 100 MG/5ML IV SOSY
PREFILLED_SYRINGE | INTRAVENOUS | Status: DC | PRN
  Administered 2020-06-18: 80 mg via INTRAVENOUS

## 2020-06-18 MED ORDER — GABAPENTIN 300 MG PO CAPS
ORAL_CAPSULE | ORAL | Status: AC
  Administered 2020-06-18: 300 mg via ORAL
  Filled 2020-06-18: qty 1

## 2020-06-18 MED ORDER — BUPIVACAINE-EPINEPHRINE (PF) 0.25% -1:200000 IJ SOLN
INTRAMUSCULAR | Status: AC
  Filled 2020-06-18: qty 30

## 2020-06-18 MED ORDER — MIDAZOLAM HCL 2 MG/2ML IJ SOLN
INTRAMUSCULAR | Status: DC | PRN
  Administered 2020-06-18: 2 mg via INTRAVENOUS

## 2020-06-18 MED ORDER — FENTANYL CITRATE (PF) 100 MCG/2ML IJ SOLN
INTRAMUSCULAR | Status: AC
  Administered 2020-06-18: 25 ug via INTRAVENOUS
  Filled 2020-06-18: qty 2

## 2020-06-18 MED ORDER — ORAL CARE MOUTH RINSE: 15.0000 mL | Freq: Once | OROMUCOSAL | Status: AC

## 2020-06-18 MED ORDER — EPHEDRINE SULFATE 50 MG/ML IJ SOLN
INTRAMUSCULAR | Status: DC | PRN
  Administered 2020-06-18: 10 mg via INTRAVENOUS

## 2020-06-18 MED ORDER — CHLORHEXIDINE GLUCONATE 0.12 % MT SOLN
OROMUCOSAL | Status: AC
  Administered 2020-06-18: 15 mL via OROMUCOSAL
  Filled 2020-06-18: qty 15

## 2020-06-18 MED ORDER — CEFAZOLIN SODIUM-DEXTROSE 2-4 GM/100ML-% IV SOLN
INTRAVENOUS | Status: AC
  Filled 2020-06-18: qty 100

## 2020-06-18 MED ORDER — DEXAMETHASONE SODIUM PHOSPHATE 10 MG/ML IJ SOLN
INTRAMUSCULAR | Status: DC | PRN
  Administered 2020-06-18: 10 mg via INTRAVENOUS

## 2020-06-18 MED ORDER — PROPOFOL 10 MG/ML IV BOLUS
INTRAVENOUS | Status: DC | PRN
  Administered 2020-06-18: 200 mg via INTRAVENOUS

## 2020-06-18 MED ORDER — LACTATED RINGERS IV SOLN: INTRAVENOUS | Status: DC

## 2020-06-18 MED ORDER — CHLORHEXIDINE GLUCONATE 0.12 % MT SOLN: 15.0000 mL | Freq: Once | OROMUCOSAL | Status: AC

## 2020-06-18 MED ORDER — GABAPENTIN 300 MG PO CAPS: 300.0000 mg | ORAL_CAPSULE | ORAL | Status: AC

## 2020-06-18 MED ORDER — CEFAZOLIN SODIUM-DEXTROSE 2-4 GM/100ML-% IV SOLN
2.0000 g | INTRAVENOUS | Status: AC
  Administered 2020-06-18: 2 g via INTRAVENOUS

## 2020-06-18 MED ORDER — HYDROCODONE-ACETAMINOPHEN 5-325 MG PO TABS: 1.0000 | ORAL_TABLET | Freq: Four times a day (QID) | ORAL | 0 refills | Status: DC | PRN

## 2020-06-18 MED ORDER — FENTANYL CITRATE (PF) 100 MCG/2ML IJ SOLN
INTRAMUSCULAR | Status: DC | PRN
  Administered 2020-06-18: 50 ug via INTRAVENOUS

## 2020-06-18 MED ORDER — FENTANYL CITRATE (PF) 100 MCG/2ML IJ SOLN
INTRAMUSCULAR | Status: AC
  Filled 2020-06-18: qty 2

## 2020-06-18 MED ORDER — IBUPROFEN 800 MG PO TABS: 800.0000 mg | ORAL_TABLET | Freq: Three times a day (TID) | ORAL | 0 refills | Status: DC | PRN

## 2020-06-18 MED ORDER — ACETAMINOPHEN 500 MG PO TABS
1000.0000 mg | ORAL_TABLET | ORAL | Status: AC
Start: 2020-06-18 — End: 2020-06-18

## 2020-06-18 MED ORDER — MIDAZOLAM HCL 2 MG/2ML IJ SOLN
INTRAMUSCULAR | Status: AC
  Filled 2020-06-18: qty 2

## 2020-06-18 MED ORDER — ACETAMINOPHEN 500 MG PO TABS
ORAL_TABLET | ORAL | Status: AC
  Administered 2020-06-18: 1000 mg via ORAL
  Filled 2020-06-18: qty 2

## 2020-06-18 SURGICAL SUPPLY — 31 items
ADH SKN CLS APL DERMABOND .7 (GAUZE/BANDAGES/DRESSINGS) ×1
APL PRP STRL LF DISP 70% ISPRP (MISCELLANEOUS) ×1
BLADE SURG 15 STRL LF DISP TIS (BLADE) ×1 IMPLANT
BLADE SURG 15 STRL SS (BLADE) ×2
CANISTER SUCT 1200ML W/VALVE (MISCELLANEOUS) ×2 IMPLANT
CHLORAPREP W/TINT 26 (MISCELLANEOUS) ×2 IMPLANT
COVER WAND RF STERILE (DRAPES) ×2 IMPLANT
DERMABOND ADVANCED (GAUZE/BANDAGES/DRESSINGS) ×1
DERMABOND ADVANCED .7 DNX12 (GAUZE/BANDAGES/DRESSINGS) ×1 IMPLANT
DEVICE DUBIN SPECIMEN MAMMOGRA (MISCELLANEOUS) ×2 IMPLANT
DRAPE LAPAROTOMY TRNSV 106X77 (MISCELLANEOUS) ×2 IMPLANT
ELECT CAUTERY BLADE TIP 2.5 (TIP) ×2
ELECT REM PT RETURN 9FT ADLT (ELECTROSURGICAL) ×2
ELECTRODE CAUTERY BLDE TIP 2.5 (TIP) ×1 IMPLANT
ELECTRODE REM PT RTRN 9FT ADLT (ELECTROSURGICAL) ×1 IMPLANT
GLOVE SURG ORTHO LTX SZ7.5 (GLOVE) ×2 IMPLANT
GOWN STRL REUS W/ TWL LRG LVL3 (GOWN DISPOSABLE) ×2 IMPLANT
GOWN STRL REUS W/TWL LRG LVL3 (GOWN DISPOSABLE) ×4
KIT TURNOVER KIT A (KITS) ×2 IMPLANT
MANIFOLD NEPTUNE II (INSTRUMENTS) ×2 IMPLANT
NEEDLE HYPO 22GX1.5 SAFETY (NEEDLE) ×2 IMPLANT
PACK BASIN MINOR ARMC (MISCELLANEOUS) ×2 IMPLANT
SET LOCALIZER 20 PROBE US (MISCELLANEOUS) ×2 IMPLANT
SUT MNCRL 4-0 (SUTURE) ×2
SUT MNCRL 4-0 27XMFL (SUTURE) ×1
SUT VIC AB 3-0 SH 27 (SUTURE) ×2
SUT VIC AB 3-0 SH 27X BRD (SUTURE) ×1 IMPLANT
SUTURE MNCRL 4-0 27XMF (SUTURE) ×1 IMPLANT
SYR 20ML LL LF (SYRINGE) ×2 IMPLANT
SYR BULB IRRIG 60ML STRL (SYRINGE) ×2 IMPLANT
WATER STERILE IRR 1000ML POUR (IV SOLUTION) ×2 IMPLANT

## 2020-06-18 NOTE — Op Note (Signed)
Left breast RF ID localized excisional biopsy.  Pre-operative Diagnosis: Atypical ductal hyperplasia left breast.   Post-operative Diagnosis: Same  Surgeon: Ronny Bacon, M.D., FACS  Anesthesia: LMA.  Procedure: Left breast excisional biopsy, RFID tag directed.   Procedure Details  The patient was seen again in the Holding Room. The benefits, complications, treatment options, and expected outcomes were discussed with the patient. The risks of bleeding, infection, recurrence of symptoms, failure to resolve symptoms, hematoma, seroma, open wound, cosmetic deformity, and the need for further surgery were discussed.  The patient was taken to Operating Room, identified as Shelby Dunn and the procedure verified.  A Time Out was held and the above information confirmed.  Prior to the induction of general anesthesia, antibiotic prophylaxis was administered. VTE prophylaxis was in place. The patient was positioned in the supine position. Appropriate anesthesia was then administered and tolerated well. The LOCALizer is used to mark the skin for incision.  The chest was prepped with Chloraprep and draped in the sterile fashion.  Attention was turned to the RFID tag localization site where an incision was made. Dissection using the LOCALizer to perform a lumpectomy with adequate margins was performed. This was done with electrocautery and sharp dissection with Mayo scissors. There was minimal bleeding, and the cavity packed.  The specimen was taken to the back table and two 3-0 Vicryl sutures were utilized to reconstruct the specimen.   I returned to the cavity to remove the packing, and hemostasis was confirmed with electrocautery.   Once assuring that hemostasis was adequate and checked multiple times the wound was closed with interrupted 3-0 Vicryl followed by 4-0 subcuticular Monocryl sutures.  Dermabond is utilized to seal the incision.  Local infiltration of quarter percent Marcaine with  epinephrine was utilized for pain control.  Findings: Faxitron imaging: Both markers well within the specimen.  Estimated Blood Loss: Minimal         Drains: None         Specimens: Left lateral breast tissue.       Complications: None         Condition: Stable  Ronny Bacon, M.D., [facility]  06/18/2020 ; 8:43 AM

## 2020-06-18 NOTE — Discharge Instructions (Signed)
AMBULATORY SURGERY  °DISCHARGE INSTRUCTIONS ° ° °1) The drugs that you were given will stay in your system until tomorrow so for the next 24 hours you should not: ° °A) Drive an automobile °B) Make any legal decisions °C) Drink any alcoholic beverage ° ° °2) You may resume regular meals tomorrow.  Today it is better to start with liquids and gradually work up to solid foods. ° °You may eat anything you prefer, but it is better to start with liquids, then soup and crackers, and gradually work up to solid foods. ° ° °3) Please notify your doctor immediately if you have any unusual bleeding, trouble breathing, redness and pain at the surgery site, drainage, fever, or pain not relieved by medication. ° ° ° °4) Additional Instructions: ° ° ° ° ° ° ° °Please contact your physician with any problems or Same Day Surgery at [phone], Monday through Friday 6 am to 4 pm, or [place] at [place] Main number at [phone]. °

## 2020-06-18 NOTE — Interval H&P Note (Signed)
History and Physical Interval Note:  06/18/2020 7:23 AM  Shelby Dunn  has presented today for surgery, with the diagnosis of left breast ADH.  The various methods of treatment have been discussed with the patient and family. After consideration of risks, benefits and other options for treatment, the patient has consented to  Procedure(s): BREAST LUMPECTOMY WITH RADIOFREQUENCY TAG IDENTIFICATION (Left) as a surgical intervention.  The patient's history has been reviewed, patient examined, no change in status, stable for surgery.  I have reviewed the patient's chart and labs.  Questions were answered to the patient's satisfaction.    Left side is marked correct.  This is an excisional biopsy AKA lumpectomy.   Ronny Bacon

## 2020-06-18 NOTE — Anesthesia Procedure Notes (Signed)
Procedure Name: LMA Insertion Date/Time: 06/18/2020 7:48 AM Performed by: Philbert Riser, CRNA Pre-anesthesia Checklist: Patient identified, Emergency Drugs available, Suction available, Patient being monitored and Timeout performed Patient Re-evaluated:Patient Re-evaluated prior to induction Oxygen Delivery Method: Circle system utilized and Simple face mask Preoxygenation: Pre-oxygenation with 100% oxygen Induction Type: IV induction LMA Size: 3.0 Dental Injury: Teeth and Oropharynx as per pre-operative assessment
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
## 2020-06-18 NOTE — Transfer of Care (Signed)
Immediate Anesthesia Transfer of Care Note  Patient: Shelby Dunn [facility]  Procedure(s) Performed: BREAST LUMPECTOMY WITH RADIOFREQUENCY TAG IDENTIFICATION (Left )  Patient Location: PACU  Anesthesia Type:General  Level of Consciousness: awake, alert  and oriented  Airway & Oxygen Therapy: Patient Spontanous Breathing and Patient connected to face mask oxygen  Post-op Assessment: Report given to RN and Post -op Vital signs reviewed and stable  Post vital signs: Reviewed and stable  Last Vitals:  Vitals Value Taken Time  BP 138/76 06/18/20 0842  Temp    Pulse 78 06/18/20 0844  Resp 16 06/18/20 0844  SpO2 100 % 06/18/20 0844  Vitals shown include unvalidated device data.  Last Pain:  Vitals:   06/18/20 0631  TempSrc: Tympanic  PainSc: 0-No pain      Patients Stated Pain Goal: 0 (61/44/31 5400)  Complications: No complications documented.

## 2020-06-19 ENCOUNTER — Other Ambulatory Visit: Payer: Self-pay | Admitting: Anatomic Pathology & Clinical Pathology

## 2020-06-19 NOTE — Progress Notes (Unsigned)
MDT

## 2020-06-20 LAB — SURGICAL PATHOLOGY

## 2020-06-22 NOTE — Anesthesia Postprocedure Evaluation (Signed)
Anesthesia Post Note  Patient: Shelby Dunn  Procedure(s) Performed: BREAST LUMPECTOMY WITH RADIOFREQUENCY TAG IDENTIFICATION (Left )  Patient location during evaluation: PACU Anesthesia Type: General Level of consciousness: awake and alert and oriented Pain management: pain level controlled Vital Signs Assessment: post-procedure vital signs reviewed and stable Respiratory status: spontaneous breathing Cardiovascular status: blood pressure returned to baseline Anesthetic complications: no   No complications documented.   Last Vitals:  Vitals:   06/18/20 0913 06/18/20 0923  BP: (!) 141/59 (!) 152/57  Pulse: 65 64  Resp: 19 16  Temp: (!) 36.2 C 36.6 C  SpO2: 100% 98%    Last Pain:  Vitals:   06/19/20 0819  TempSrc:   PainSc: 2                  Chyla Schlender

## 2020-06-28 ENCOUNTER — Ambulatory Visit (INDEPENDENT_AMBULATORY_CARE_PROVIDER_SITE_OTHER): Payer: 59 | Admitting: Surgery

## 2020-06-28 ENCOUNTER — Encounter: Payer: Self-pay | Admitting: Surgery

## 2020-06-28 ENCOUNTER — Other Ambulatory Visit: Payer: Self-pay

## 2020-06-28 ENCOUNTER — Encounter: Payer: Self-pay | Admitting: *Deleted

## 2020-06-28 VITALS — BP 132/75 | HR 69 | Temp 98.1°F | Ht 61.0 in | Wt 145.8 lb

## 2020-06-28 DIAGNOSIS — D0512 Intraductal carcinoma in situ of left breast: Secondary | ICD-10-CM

## 2020-06-28 NOTE — Progress Notes (Signed)
Received message from [facility] that patient had been referred to the cancer center for new diagnosis of DCIS.  Called patient with appointment to see Dr. Baruch Gouty and Dr. Tasia Catchings on Friday June 3.  Patient states she is also scheduled at [facility].  She will confirm where her insurance will cover and let me now next week.  If possible she prefers to stay at [facility].

## 2020-06-28 NOTE — Patient Instructions (Addendum)
Referral sent to Dr.Chrystal at this time. Someone from their office will call to schedule an appointment within 5-7 days. If you do not hear from their office within this time frame please call our office so we can check on this for you.   We will reach out to you October 2022 to schedule a diagnostic mammogram and follow up appointment with Dr.Rodenberg for November 2022.   Please call our office if you have any questions or concerns.

## 2020-06-28 NOTE — Progress Notes (Signed)
[facility] POST-OP OFFICE VISIT  06/28/2020  HPI: Shelby Dunn is a 55 y.o. female 10 days s/p left breast lumpectomy.  She reports some lingering throbbing discomfort, but denies any healing issues.  Denies any ecchymosis, discoloration.  She reports the glue is intact, and just assumed of her exam today.  She denies any fevers and chills.  Vital signs: BP 132/75   Pulse 69   Temp 98.1 F (36.7 C) (Oral)   Ht 5\' 1"  (1.549 m)   Wt 145 lb 12.8 oz (66.1 kg)   BMI 27.55 kg/m    Physical Exam: Constitutional: She appears well.     SURGICAL PATHOLOGY  * THIS IS AN ADDENDUM REPORT *  CASE: [id number] [id number]  PATIENT: Shelby Dunn  Surgical Pathology Report  *Addendum *   Reason for Addendum #1: Breast Biomarker Results   Specimen Submitted:  A. Breast, left, lateral; excision   Clinical History: Left breast papilloma with ADH      DIAGNOSIS:  A. 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  - BIOPSY SITE CHANGES, MARKER CLIP, AND RFID TAG PRESENT.  - MARGINS ARE NEGATIVE FOR PAPILLOMA AND DCIS.   Comment:  There is residual papilloma with partial involvement by low-grade DCIS.  Based on the residual DCIS and review of the original biopsy slides  ([id number]), the extent of DCIS is estimated at 5 mm. The ducts  adjacent to the papilloma are free of DCIS.   CANCER CASE SUMMARY: DUCTAL CARCINOMA IN SITU OF THE BREAST  Standard(s): AJCC-UICC 8   SPECIMEN  Procedure: Excision  Specimen Laterality: Left   TUMOR  Histologic Type: Papilloma with DCIS  Size (Extent) of DCIS: at least 5 mm  Nuclear Grade: Grade 1 (low)  Necrosis: Not identified   MARGINS  Margin Status: All margins negative for DCIS            Distance from DCIS to closest margin: 8 mm            Specify closest margin: Cannot be determined      Assessment/Plan: This  is a 55 y.o. female 10 days s/p left breast RF ID lumpectomy for ADH, now diagnosed with low grade DCIS.  Patient Active Problem List   Diagnosis Date Noted  . Atypical ductal hyperplasia of left breast 06/05/2020  . Stomach irritation   . Esophageal dysphagia   . Special screening for malignant neoplasm of intestine   . Polyp of sigmoid colon   . Internal hemorrhoids   . Diverticulosis of large intestine without diverticulitis     -Referrals to radiation oncology and heme oncology, to consider long-term endocrine prophylaxis and the risks/benefits of XRT in low-grade DCIS. We will repeat a diagnostic left breast mammogram in 6 months.  Advise she will continue to have mammographic follow-up in years to come.  Ronny Bacon M.D., FACS 06/28/2020, 9:10 AM

## 2020-07-05 ENCOUNTER — Ambulatory Visit: Payer: 59 | Admitting: Radiation Oncology

## 2020-07-05 ENCOUNTER — Ambulatory Visit: Payer: 59

## 2020-07-06 ENCOUNTER — Encounter: Payer: Self-pay | Admitting: Radiation Oncology

## 2020-07-06 ENCOUNTER — Ambulatory Visit
Admission: RE | Admit: 2020-07-06 | Discharge: 2020-07-06 | Disposition: A | Discharge status: Home / Self Care | Payer: 59 | Source: Ambulatory Visit | Attending: Radiation Oncology | Admitting: Radiation Oncology

## 2020-07-06 ENCOUNTER — Encounter: Payer: Self-pay | Admitting: Oncology

## 2020-07-06 ENCOUNTER — Inpatient Hospital Stay: Payer: 59

## 2020-07-06 ENCOUNTER — Inpatient Hospital Stay (HOSPITAL_BASED_OUTPATIENT_CLINIC_OR_DEPARTMENT_OTHER): Payer: 59 | Admitting: Oncology

## 2020-07-06 VITALS — BP 142/64 | HR 69 | Temp 98.6°F | Wt 148.0 lb

## 2020-07-06 VITALS — BP 142/64 | HR 69 | Temp 98.6°F | Resp 16 | Wt 148.0 lb

## 2020-07-06 DIAGNOSIS — Z170 Estrogen receptor positive status [ER+]: Secondary | ICD-10-CM | POA: Insufficient documentation

## 2020-07-06 DIAGNOSIS — Z8249 Family history of ischemic heart disease and other diseases of the circulatory system: Secondary | ICD-10-CM | POA: Insufficient documentation

## 2020-07-06 DIAGNOSIS — Z87891 Personal history of nicotine dependence: Secondary | ICD-10-CM | POA: Insufficient documentation

## 2020-07-06 DIAGNOSIS — E785 Hyperlipidemia, unspecified: Secondary | ICD-10-CM

## 2020-07-06 DIAGNOSIS — Z791 Long term (current) use of non-steroidal anti-inflammatories (NSAID): Secondary | ICD-10-CM | POA: Insufficient documentation

## 2020-07-06 DIAGNOSIS — Z510 Encounter for antineoplastic radiation therapy: Secondary | ICD-10-CM | POA: Diagnosis not present

## 2020-07-06 DIAGNOSIS — D0512 Intraductal carcinoma in situ of left breast: Secondary | ICD-10-CM | POA: Diagnosis not present

## 2020-07-06 DIAGNOSIS — F418 Other specified anxiety disorders: Secondary | ICD-10-CM | POA: Insufficient documentation

## 2020-07-06 DIAGNOSIS — R011 Cardiac murmur, unspecified: Secondary | ICD-10-CM | POA: Insufficient documentation

## 2020-07-06 DIAGNOSIS — Z7189 Other specified counseling: Secondary | ICD-10-CM

## 2020-07-06 DIAGNOSIS — I10 Essential (primary) hypertension: Secondary | ICD-10-CM | POA: Insufficient documentation

## 2020-07-06 DIAGNOSIS — Z780 Asymptomatic menopausal state: Secondary | ICD-10-CM

## 2020-07-06 DIAGNOSIS — Z79899 Other long term (current) drug therapy: Secondary | ICD-10-CM | POA: Insufficient documentation

## 2020-07-06 DIAGNOSIS — Z803 Family history of malignant neoplasm of breast: Secondary | ICD-10-CM

## 2020-07-06 DIAGNOSIS — K219 Gastro-esophageal reflux disease without esophagitis: Secondary | ICD-10-CM | POA: Insufficient documentation

## 2020-07-06 LAB — CBC WITH DIFFERENTIAL/PLATELET
Abs Immature Granulocytes: 0.02 10*3/uL (ref 0.00–0.07)
Basophils Absolute: 0 10*3/uL (ref 0.0–0.1)
Basophils Relative: 1 %
Eosinophils Absolute: 0.1 10*3/uL (ref 0.0–0.5)
Eosinophils Relative: 1 %
HCT: 37.1 % (ref 36.0–46.0)
Hemoglobin: 12.1 g/dL (ref 12.0–15.0)
Immature Granulocytes: 0 %
Lymphocytes Relative: 30 %
Lymphs Abs: 1.4 10*3/uL (ref 0.7–4.0)
MCH: 30 pg (ref 26.0–34.0)
MCHC: 32.6 g/dL (ref 30.0–36.0)
MCV: 92.1 fL (ref 80.0–100.0)
Monocytes Absolute: 0.4 10*3/uL (ref 0.1–1.0)
Monocytes Relative: 10 %
Neutro Abs: 2.6 10*3/uL (ref 1.7–7.7)
Neutrophils Relative %: 58 %
Platelets: 185 10*3/uL (ref 150–400)
RBC: 4.03 MIL/uL (ref 3.87–5.11)
RDW: 13.6 % (ref 11.5–15.5)
WBC: 4.5 10*3/uL (ref 4.0–10.5)
nRBC: 0 % (ref 0.0–0.2)

## 2020-07-06 LAB — COMPREHENSIVE METABOLIC PANEL
ALT: 31 U/L (ref 0–44)
AST: 29 U/L (ref 15–41)
Albumin: 4 g/dL (ref 3.5–5.0)
Alkaline Phosphatase: 79 U/L (ref 38–126)
Anion gap: 7 (ref 5–15)
BUN: 18 mg/dL (ref 6–20)
CO2: 28 mmol/L (ref 22–32)
Calcium: 9.2 mg/dL (ref 8.9–10.3)
Chloride: 106 mmol/L (ref 98–111)
Creatinine, Ser: 0.64 mg/dL (ref 0.44–1.00)
GFR, Estimated: >60 mL/min (ref >60)
Glucose, Bld: 89 mg/dL (ref 70–99)
Potassium: 4.3 mmol/L (ref 3.5–5.1)
Sodium: 141 mmol/L (ref 135–145)
Total Bilirubin: 0.6 mg/dL (ref 0.3–1.2)
Total Protein: 6.9 g/dL (ref 6.5–8.1)

## 2020-07-06 NOTE — Progress Notes (Signed)
New patient evaluation.   
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
## 2020-07-06 NOTE — Research (Signed)
Trial:  AFT-25 COMET  Patient Shelby Dunn was identified by Jeral Fruit, RN as a potential candidate for the above listed study.  This Clinical Research Nurse met with Shelby Dunn, [id number], on 07/06/20 in a manner and location that ensures patient privacy to discuss participation in the above listed research study.  Patient is Accompanied by spouse.  A copy of the informed consent document and separate HIPAA Authorization was provided to the patient.  Patient reads, speaks, and understands [place].   Patient was provided with the business card of this Nurse and encouraged to contact the research team with any questions.  Approximately 15 minutes were spent with the patient reviewing the informed consent documents.  Patient was provided the option of taking informed consent documents home to review and was encouraged to review at their convenience with their support network, including other care providers. Patient took the consent documents home to review. Research will meet with the patient next Tuesday after her radiation appointment to answer any questions she may have.  Jeral Fruit, RN 07/06/20 2:53 PM

## 2020-07-09 ENCOUNTER — Encounter: Payer: Self-pay | Admitting: Oncology

## 2020-07-10 ENCOUNTER — Telehealth: Payer: Self-pay

## 2020-07-10 ENCOUNTER — Ambulatory Visit
Admission: RE | Admit: 2020-07-10 | Discharge: 2020-07-10 | Disposition: A | Discharge status: Home / Self Care | Payer: 59 | Source: Ambulatory Visit | Attending: Radiation Oncology | Admitting: Radiation Oncology

## 2020-07-10 DIAGNOSIS — Z803 Family history of malignant neoplasm of breast: Secondary | ICD-10-CM | POA: Insufficient documentation

## 2020-07-10 DIAGNOSIS — E785 Hyperlipidemia, unspecified: Secondary | ICD-10-CM | POA: Insufficient documentation

## 2020-07-10 DIAGNOSIS — Z510 Encounter for antineoplastic radiation therapy: Secondary | ICD-10-CM | POA: Diagnosis not present

## 2020-07-10 DIAGNOSIS — D0512 Intraductal carcinoma in situ of left breast: Secondary | ICD-10-CM | POA: Insufficient documentation

## 2020-07-10 DIAGNOSIS — Z8249 Family history of ischemic heart disease and other diseases of the circulatory system: Secondary | ICD-10-CM | POA: Insufficient documentation

## 2020-07-10 DIAGNOSIS — I10 Essential (primary) hypertension: Secondary | ICD-10-CM | POA: Insufficient documentation

## 2020-07-10 DIAGNOSIS — Z87891 Personal history of nicotine dependence: Secondary | ICD-10-CM | POA: Insufficient documentation

## 2020-07-10 DIAGNOSIS — Z79899 Other long term (current) drug therapy: Secondary | ICD-10-CM | POA: Insufficient documentation

## 2020-07-10 NOTE — Telephone Encounter (Signed)
Research nurse called the patient to inform her that she is not eligible for the AFT-25 COMET protocol. She states that she is fine with not being eligible and is glad to know that. The patient denied having any further questions concerning the clinical trial. Research RN thanked her for taking the time to consider participating in our clinical trials here and to please call in the future if she ever has any questions concerning research.  Jeral Fruit, RN 07/10/20 12:19 PM

## 2020-07-12 ENCOUNTER — Telehealth: Payer: Self-pay | Admitting: *Deleted

## 2020-07-12 DIAGNOSIS — Z510 Encounter for antineoplastic radiation therapy: Secondary | ICD-10-CM | POA: Diagnosis not present

## 2020-07-12 NOTE — Telephone Encounter (Signed)
Patient called stating that she has an appointment 6/14 and she wants to discuss insurance for this appointment. Please return her call [phone]

## 2020-07-13 ENCOUNTER — Other Ambulatory Visit: Payer: Self-pay | Admitting: *Deleted

## 2020-07-13 DIAGNOSIS — D0512 Intraductal carcinoma in situ of left breast: Secondary | ICD-10-CM

## 2020-07-17 ENCOUNTER — Inpatient Hospital Stay: Payer: 59 | Admitting: Licensed Clinical Social Worker

## 2020-07-17 ENCOUNTER — Inpatient Hospital Stay (HOSPITAL_BASED_OUTPATIENT_CLINIC_OR_DEPARTMENT_OTHER): Payer: 59 | Admitting: Licensed Clinical Social Worker

## 2020-07-17 ENCOUNTER — Encounter: Payer: Self-pay | Admitting: Licensed Clinical Social Worker

## 2020-07-17 ENCOUNTER — Other Ambulatory Visit: Payer: Self-pay

## 2020-07-17 ENCOUNTER — Inpatient Hospital Stay: Payer: 59

## 2020-07-17 ENCOUNTER — Ambulatory Visit: Admission: RE | Admit: 2020-07-17 | Payer: 59 | Source: Ambulatory Visit

## 2020-07-17 DIAGNOSIS — Z803 Family history of malignant neoplasm of breast: Secondary | ICD-10-CM

## 2020-07-17 DIAGNOSIS — D0512 Intraductal carcinoma in situ of left breast: Secondary | ICD-10-CM | POA: Diagnosis not present

## 2020-07-17 DIAGNOSIS — Z510 Encounter for antineoplastic radiation therapy: Secondary | ICD-10-CM | POA: Diagnosis not present

## 2020-07-17 DIAGNOSIS — Z800 Family history of malignant neoplasm of digestive organs: Secondary | ICD-10-CM | POA: Diagnosis not present
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
## 2020-07-18 ENCOUNTER — Encounter: Payer: 59 | Admitting: Licensed Clinical Social Worker

## 2020-07-18 ENCOUNTER — Other Ambulatory Visit: Payer: 59

## 2020-07-18 ENCOUNTER — Ambulatory Visit
Admission: RE | Admit: 2020-07-18 | Discharge: 2020-07-18 | Disposition: A | Discharge status: Home / Self Care | Payer: 59 | Source: Ambulatory Visit | Attending: Radiation Oncology | Admitting: Radiation Oncology

## 2020-07-18 DIAGNOSIS — Z510 Encounter for antineoplastic radiation therapy: Secondary | ICD-10-CM | POA: Diagnosis not present

## 2020-07-19 ENCOUNTER — Ambulatory Visit
Admission: RE | Admit: 2020-07-19 | Discharge: 2020-07-19 | Disposition: A | Discharge status: Home / Self Care | Payer: 59 | Source: Ambulatory Visit | Attending: Radiation Oncology | Admitting: Radiation Oncology

## 2020-07-19 DIAGNOSIS — Z510 Encounter for antineoplastic radiation therapy: Secondary | ICD-10-CM | POA: Diagnosis not present

## 2020-07-20 ENCOUNTER — Ambulatory Visit
Admission: RE | Admit: 2020-07-20 | Discharge: 2020-07-20 | Disposition: A | Discharge status: Home / Self Care | Payer: 59 | Source: Ambulatory Visit | Attending: Radiation Oncology | Admitting: Radiation Oncology

## 2020-07-20 DIAGNOSIS — Z510 Encounter for antineoplastic radiation therapy: Secondary | ICD-10-CM | POA: Diagnosis not present

## 2020-07-23 ENCOUNTER — Ambulatory Visit
Admission: RE | Admit: 2020-07-23 | Discharge: 2020-07-23 | Disposition: A | Discharge status: Home / Self Care | Payer: 59 | Source: Ambulatory Visit | Attending: Radiation Oncology | Admitting: Radiation Oncology

## 2020-07-23 DIAGNOSIS — Z510 Encounter for antineoplastic radiation therapy: Secondary | ICD-10-CM | POA: Diagnosis not present

## 2020-07-24 ENCOUNTER — Ambulatory Visit
Admission: RE | Admit: 2020-07-24 | Discharge: 2020-07-24 | Disposition: A | Discharge status: Home / Self Care | Payer: 59 | Source: Ambulatory Visit | Attending: Radiation Oncology | Admitting: Radiation Oncology

## 2020-07-24 DIAGNOSIS — Z510 Encounter for antineoplastic radiation therapy: Secondary | ICD-10-CM | POA: Diagnosis not present

## 2020-07-25 ENCOUNTER — Ambulatory Visit
Admission: RE | Admit: 2020-07-25 | Discharge: 2020-07-25 | Disposition: A | Discharge status: Home / Self Care | Payer: 59 | Source: Ambulatory Visit | Attending: Radiation Oncology | Admitting: Radiation Oncology

## 2020-07-25 DIAGNOSIS — Z510 Encounter for antineoplastic radiation therapy: Secondary | ICD-10-CM | POA: Diagnosis not present

## 2020-07-26 ENCOUNTER — Ambulatory Visit
Admission: RE | Admit: 2020-07-26 | Discharge: 2020-07-26 | Disposition: A | Discharge status: Home / Self Care | Payer: 59 | Source: Ambulatory Visit | Attending: Radiation Oncology | Admitting: Radiation Oncology

## 2020-07-26 DIAGNOSIS — Z510 Encounter for antineoplastic radiation therapy: Secondary | ICD-10-CM | POA: Diagnosis not present

## 2020-07-27 ENCOUNTER — Ambulatory Visit
Admission: RE | Admit: 2020-07-27 | Discharge: 2020-07-27 | Disposition: A | Discharge status: Home / Self Care | Payer: 59 | Source: Ambulatory Visit | Attending: Radiation Oncology | Admitting: Radiation Oncology

## 2020-07-27 DIAGNOSIS — Z510 Encounter for antineoplastic radiation therapy: Secondary | ICD-10-CM | POA: Diagnosis not present

## 2020-07-30 ENCOUNTER — Ambulatory Visit
Admission: RE | Admit: 2020-07-30 | Discharge: 2020-07-30 | Disposition: A | Discharge status: Home / Self Care | Payer: 59 | Source: Ambulatory Visit | Attending: Radiation Oncology | Admitting: Radiation Oncology

## 2020-07-30 ENCOUNTER — Telehealth: Payer: Self-pay | Admitting: Licensed Clinical Social Worker

## 2020-07-30 ENCOUNTER — Ambulatory Visit: Payer: Self-pay | Admitting: Licensed Clinical Social Worker

## 2020-07-30 ENCOUNTER — Encounter: Payer: Self-pay | Admitting: Licensed Clinical Social Worker

## 2020-07-30 DIAGNOSIS — Z1379 Encounter for other screening for genetic and chromosomal anomalies: Secondary | ICD-10-CM

## 2020-07-30 DIAGNOSIS — Z803 Family history of malignant neoplasm of breast: Secondary | ICD-10-CM

## 2020-07-30 DIAGNOSIS — Z510 Encounter for antineoplastic radiation therapy: Secondary | ICD-10-CM | POA: Diagnosis not present

## 2020-07-30 DIAGNOSIS — D0512 Intraductal carcinoma in situ of left breast: Secondary | ICD-10-CM

## 2020-07-30 DIAGNOSIS — Z800 Family history of malignant neoplasm of digestive organs: Secondary | ICD-10-CM

## 2020-07-30 NOTE — Telephone Encounter (Signed)
Revealed negative genetic testing.  This normal result is reassuring and indicates that it is unlikely Shelby Dunn's cancer is due to a hereditary cause.  It is unlikely that there is an increased risk of another cancer due to a mutation in one of these genes.  However, genetic testing is not perfect, and cannot definitively rule out a hereditary cause.  It will be important for her to keep in contact with genetics to learn if any additional testing may be needed in the future.

## 2020-07-30 NOTE — Progress Notes (Signed)
HPI:  [name] was previously seen in the [place] clinic due to a personal and family history of cancer and concerns regarding a hereditary predisposition to cancer. Please refer to our prior cancer genetics clinic note for more information regarding our discussion, assessment and recommendations, at the time. Shelby Dunn recent genetic test results were disclosed to her, as were recommendations warranted by these results. These results and recommendations are discussed in more detail below.  CANCER HISTORY:  Oncology History  Ductal carcinoma in situ (DCIS) of left breast  06/28/2020 Initial Diagnosis   Ductal carcinoma in situ (DCIS) of left breast    07/06/2020 Cancer Staging   Staging form: Breast, AJCC 8th Edition - Pathologic stage from 07/06/2020: Stage 0 (pTis (DCIS), cN0, cM0, G1, ER+, PR: Not Assessed, HER2: Not Assessed) - Signed by Shelby Server, MD on 07/06/2020  Stage prefix: Initial diagnosis  Method of lymph node assessment: Clinical  Nuclear grade: G1  Multigene prognostic tests performed: None  Histologic grading system: 3 grade system     Genetic Testing   Negative genetic testing. No pathogenic variants identified on the [facility] CustomNext+RNA Panel. The report date is 07/28/2020.  The CustomNext-Expanded + RNAinsight gene panel offered by [employer] and includes sequencing and rearrangement analysis for the following 47 genes:  APC, ATM, AXIN2, BARD1, BMPR1A, BRCA1, BRCA2, BRIP1, CDH1, CDKN2A (p14ARF), CDKN2A (p16INK4a), CKD4, CHEK2, CTNNA1, DICER1, EPCAM (Deletion/duplication testing only), GREM1 (promoter region deletion/duplication testing only), KIT, MEN1, MLH1, MSH2, MSH3, MSH6, MUTYH, NBN, NF1, NHTL1, PALB2, PDGFRA, PMS2, POLD1, POLE, PTEN, RAD50, RAD51C, RAD51D, SDHB, SDHC, SDHD, SMAD4, SMARCA4. STK11, TP53, TSC1, TSC2, and VHL.  The following genes were evaluated for sequence changes only: SDHA and HOXB13 c.251G>A variant only.     FAMILY HISTORY:   We obtained a detailed, 4-generation family history.  Significant diagnoses are listed below: Family History  Problem Relation Age of Onset   Breast cancer Mother 1       double mastectomy   Breast cancer Cousin 94       Paternal   Diabetes Granddaughter        type 1   Heart attack Father 4   Breast cancer Cousin     Shelby Dunn has 1 daughter, 47, and 1 granddaughter, 9 who she cares for currently. Patient has 2 sisters and 1 brother. Her sister had a desmoid tumor at age 44 and is followed by [facility] for this.   Shelby Dunn mother had breast cancer at 71 and is living at 21. Patient had 4 maternal aunts, 5 maternal uncles, none had cancer. No cancers for maternal grandparents.   Shelby Dunn father died at 21 of a heart attack. Patient had 3 paternal aunts, 2 uncles. One aunt recently was diagnosed with colon cancer at 49. She had 2 first cousins who had breast cancer, from it at 62 and one was diagnosed at 64 and is living in her 67s. Paternal grandmother died at 9, grandfather died at 35.   Shelby Dunn is unaware of previous family history of genetic testing for hereditary cancer risks. Patient's maternal ancestors are of American Indian/unknown descent, and paternal ancestors are of Irish/unknown descent. There is no reported Ashkenazi Jewish ancestry. There is no known consanguinity.     GENETIC TEST RESULTS: Genetic testing reported out on 07/28/2020 through the [facility] cancer panel found no pathogenic mutations.   The CustomNext-Expanded + RNAinsight gene panel offered by [employer] and includes sequencing and rearrangement analysis  for the following 47 genes:  APC, ATM, AXIN2, BARD1, BMPR1A, BRCA1, BRCA2, BRIP1, CDH1, CDKN2A (p14ARF), CDKN2A (p16INK4a), CKD4, CHEK2, CTNNA1, DICER1, EPCAM (Deletion/duplication testing only), GREM1 (promoter region deletion/duplication testing only), KIT, MEN1, MLH1, MSH2, MSH3, MSH6, MUTYH, NBN, NF1, NHTL1, PALB2, PDGFRA, PMS2, POLD1,  POLE, PTEN, RAD50, RAD51C, RAD51D, SDHB, SDHC, SDHD, SMAD4, SMARCA4. STK11, TP53, TSC1, TSC2, and VHL.  The following genes were evaluated for sequence changes only: SDHA and HOXB13 c.251G>A variant only.  The test report has been scanned into EPIC and is located under the Molecular Pathology section of the Results Review tab.  A portion of the result report is included below for reference.     We discussed that because current genetic testing is not perfect, it is possible there may be a gene mutation in one of these genes that current testing cannot detect, but that chance is small.  There could be another gene that has not yet been discovered, or that we have not yet tested, that is responsible for the cancer diagnoses in the family. It is also possible there is a hereditary cause for the cancer in the family that Shelby Dunn did not inherit and therefore was not identified in her testing.  Therefore, it is important to remain in touch with cancer genetics in the future so that we can continue to offer Shelby Dunn the most up to date genetic testing.   ADDITIONAL GENETIC TESTING: We discussed with Shelby Dunn that her genetic testing was fairly extensive.  If there are genes identified to increase cancer risk that can be analyzed in the future, we would be happy to discuss and coordinate this testing at that time.    CANCER SCREENING RECOMMENDATIONS: Shelby Dunn test result is considered negative (normal).  This means that we have not identified a hereditary cause for her  personal and family history of cancer at this time. Most cancers happen by chance and this negative test suggests that her cancer may fall into this category.    While reassuring, this does not definitively rule out a hereditary predisposition to cancer. It is still possible that there could be genetic mutations that are undetectable by current technology. There could be genetic mutations in genes that have not been tested or identified  to increase cancer risk.  Therefore, it is recommended she continue to follow the cancer management and screening guidelines provided by her oncology and primary healthcare provider.   An individual's cancer risk and medical management are not determined by genetic test results alone. Overall cancer risk assessment incorporates additional factors, including personal medical history, family history, and any available genetic information that may result in a personalized plan for cancer prevention and surveillance.   RECOMMENDATIONS FOR FAMILY MEMBERS:  Relatives in this family might be at some increased risk of developing cancer, over the general population risk, simply due to the family history of cancer.  We recommended female relatives in this family have a yearly mammogram beginning at age 68, or 27 years younger than the earliest onset of cancer, an annual clinical breast exam, and perform monthly breast self-exams. Female relatives in this family should also have a gynecological exam as recommended by their primary provider.  All family members should be referred for colonoscopy starting at age 10.    It is also possible there is a hereditary cause for the cancer in Shelby Dunn's family that she did not inherit and therefore was not identified in her.  Based on Shelby Dunn's family  history, we recommended her sister who had a desmoid tumor have genetic counseling and testing. Shelby Dunn will let us know if we can be of any assistance in coordinating genetic counseling and/or testing for these family members.  FOLLOW-UP: Lastly, we discussed with Shelby Dunn that cancer genetics is a rapidly advancing field and it is possible that new genetic tests will be appropriate for her and/or her family members in the future. We encouraged her to remain in contact with cancer genetics on an annual basis so we can update her personal and family histories and let her know of advances in cancer genetics that may benefit  this family.   Our contact number was provided. Shelby Dunn questions were answered to her satisfaction, and she knows she is welcome to call us at anytime with additional questions or concerns.   Faith Rogue, MS, [facility] Genetic Counselor [email or web address] Phone: [phone]

## 2020-07-31 ENCOUNTER — Ambulatory Visit: Payer: 59

## 2020-07-31 ENCOUNTER — Ambulatory Visit
Admission: RE | Admit: 2020-07-31 | Discharge: 2020-07-31 | Disposition: A | Discharge status: Home / Self Care | Payer: 59 | Source: Ambulatory Visit | Attending: Radiation Oncology | Admitting: Radiation Oncology

## 2020-07-31 DIAGNOSIS — Z510 Encounter for antineoplastic radiation therapy: Secondary | ICD-10-CM | POA: Diagnosis not present

## 2020-08-01 ENCOUNTER — Ambulatory Visit
Admission: RE | Admit: 2020-08-01 | Discharge: 2020-08-01 | Disposition: A | Discharge status: Home / Self Care | Payer: 59 | Source: Ambulatory Visit | Attending: Radiation Oncology | Admitting: Radiation Oncology

## 2020-08-01 ENCOUNTER — Inpatient Hospital Stay: Payer: 59

## 2020-08-01 DIAGNOSIS — Z510 Encounter for antineoplastic radiation therapy: Secondary | ICD-10-CM | POA: Diagnosis not present

## 2020-08-01 DIAGNOSIS — D0512 Intraductal carcinoma in situ of left breast: Secondary | ICD-10-CM

## 2020-08-01 LAB — CBC
HCT: 39.1 % (ref 36.0–46.0)
Hemoglobin: 12.6 g/dL (ref 12.0–15.0)
MCH: 30 pg (ref 26.0–34.0)
MCHC: 32.2 g/dL (ref 30.0–36.0)
MCV: 93.1 fL (ref 80.0–100.0)
Platelets: 191 10*3/uL (ref 150–400)
RBC: 4.2 MIL/uL (ref 3.87–5.11)
RDW: 13.5 % (ref 11.5–15.5)
WBC: 4.3 10*3/uL (ref 4.0–10.5)
nRBC: 0 % (ref 0.0–0.2)

## 2020-08-02 ENCOUNTER — Ambulatory Visit
Admission: RE | Admit: 2020-08-02 | Discharge: 2020-08-02 | Disposition: A | Discharge status: Home / Self Care | Payer: 59 | Source: Ambulatory Visit | Attending: Radiation Oncology | Admitting: Radiation Oncology

## 2020-08-02 DIAGNOSIS — Z510 Encounter for antineoplastic radiation therapy: Secondary | ICD-10-CM | POA: Diagnosis not present

## 2020-08-03 ENCOUNTER — Ambulatory Visit
Admission: RE | Admit: 2020-08-03 | Discharge: 2020-08-03 | Disposition: A | Discharge status: Home / Self Care | Payer: 59 | Source: Ambulatory Visit | Attending: Radiation Oncology | Admitting: Radiation Oncology

## 2020-08-03 DIAGNOSIS — Z510 Encounter for antineoplastic radiation therapy: Secondary | ICD-10-CM | POA: Insufficient documentation

## 2020-08-03 DIAGNOSIS — D0512 Intraductal carcinoma in situ of left breast: Secondary | ICD-10-CM | POA: Insufficient documentation

## 2020-08-03 DIAGNOSIS — Z803 Family history of malignant neoplasm of breast: Secondary | ICD-10-CM | POA: Insufficient documentation

## 2020-08-03 DIAGNOSIS — E785 Hyperlipidemia, unspecified: Secondary | ICD-10-CM | POA: Insufficient documentation

## 2020-08-03 DIAGNOSIS — Z8249 Family history of ischemic heart disease and other diseases of the circulatory system: Secondary | ICD-10-CM | POA: Diagnosis not present

## 2020-08-03 DIAGNOSIS — Z87891 Personal history of nicotine dependence: Secondary | ICD-10-CM | POA: Insufficient documentation

## 2020-08-03 DIAGNOSIS — Z79899 Other long term (current) drug therapy: Secondary | ICD-10-CM | POA: Insufficient documentation

## 2020-08-03 DIAGNOSIS — I10 Essential (primary) hypertension: Secondary | ICD-10-CM | POA: Insufficient documentation

## 2020-08-07 ENCOUNTER — Ambulatory Visit: Payer: 59

## 2020-08-08 ENCOUNTER — Ambulatory Visit: Payer: 59

## 2020-08-08 DIAGNOSIS — Z510 Encounter for antineoplastic radiation therapy: Secondary | ICD-10-CM | POA: Diagnosis not present

## 2020-08-09 ENCOUNTER — Ambulatory Visit: Payer: 59

## 2020-08-10 ENCOUNTER — Ambulatory Visit: Payer: 59

## 2020-08-13 ENCOUNTER — Ambulatory Visit
Admission: RE | Admit: 2020-08-13 | Discharge: 2020-08-13 | Disposition: A | Discharge status: Home / Self Care | Payer: 59 | Source: Ambulatory Visit | Attending: Radiation Oncology | Admitting: Radiation Oncology

## 2020-08-13 ENCOUNTER — Telehealth: Payer: Self-pay

## 2020-08-13 DIAGNOSIS — Z510 Encounter for antineoplastic radiation therapy: Secondary | ICD-10-CM | POA: Diagnosis not present

## 2020-08-13 NOTE — Telephone Encounter (Signed)
Pt scheduled for final XRT on 7/20. Please schedule MD follow up approx weeks after final XRT and notify pt of appt. thanks

## 2020-08-14 ENCOUNTER — Ambulatory Visit
Admission: RE | Admit: 2020-08-14 | Discharge: 2020-08-14 | Disposition: A | Discharge status: Home / Self Care | Payer: 59 | Source: Ambulatory Visit | Attending: Radiation Oncology | Admitting: Radiation Oncology

## 2020-08-14 DIAGNOSIS — Z510 Encounter for antineoplastic radiation therapy: Secondary | ICD-10-CM | POA: Diagnosis not present

## 2020-08-15 ENCOUNTER — Ambulatory Visit: Payer: 59

## 2020-08-16 ENCOUNTER — Ambulatory Visit: Payer: 59

## 2020-08-16 ENCOUNTER — Ambulatory Visit
Admission: RE | Admit: 2020-08-16 | Discharge: 2020-08-16 | Disposition: A | Discharge status: Home / Self Care | Payer: 59 | Source: Ambulatory Visit | Attending: Radiation Oncology | Admitting: Radiation Oncology

## 2020-08-16 DIAGNOSIS — Z510 Encounter for antineoplastic radiation therapy: Secondary | ICD-10-CM | POA: Diagnosis not present

## 2020-08-17 ENCOUNTER — Ambulatory Visit: Payer: 59

## 2020-08-17 ENCOUNTER — Ambulatory Visit
Admission: RE | Admit: 2020-08-17 | Discharge: 2020-08-17 | Disposition: A | Discharge status: Home / Self Care | Payer: 59 | Source: Ambulatory Visit | Attending: Radiation Oncology | Admitting: Radiation Oncology

## 2020-08-17 DIAGNOSIS — Z510 Encounter for antineoplastic radiation therapy: Secondary | ICD-10-CM | POA: Diagnosis not present

## 2020-08-20 ENCOUNTER — Ambulatory Visit
Admission: RE | Admit: 2020-08-20 | Discharge: 2020-08-20 | Disposition: A | Discharge status: Home / Self Care | Payer: 59 | Source: Ambulatory Visit | Attending: Radiation Oncology | Admitting: Radiation Oncology

## 2020-08-20 DIAGNOSIS — Z510 Encounter for antineoplastic radiation therapy: Secondary | ICD-10-CM | POA: Diagnosis not present

## 2020-08-21 ENCOUNTER — Ambulatory Visit
Admission: RE | Admit: 2020-08-21 | Discharge: 2020-08-21 | Disposition: A | Discharge status: Home / Self Care | Payer: 59 | Source: Ambulatory Visit | Attending: Radiation Oncology | Admitting: Radiation Oncology

## 2020-08-21 DIAGNOSIS — Z510 Encounter for antineoplastic radiation therapy: Secondary | ICD-10-CM | POA: Diagnosis not present

## 2020-08-22 ENCOUNTER — Ambulatory Visit: Payer: 59

## 2020-08-22 ENCOUNTER — Ambulatory Visit
Admission: RE | Admit: 2020-08-22 | Discharge: 2020-08-22 | Disposition: A | Discharge status: Home / Self Care | Payer: 59 | Source: Ambulatory Visit | Attending: Radiation Oncology | Admitting: Radiation Oncology

## 2020-08-22 DIAGNOSIS — Z510 Encounter for antineoplastic radiation therapy: Secondary | ICD-10-CM | POA: Diagnosis not present

## 2020-08-23 ENCOUNTER — Ambulatory Visit
Admission: RE | Admit: 2020-08-23 | Discharge: 2020-08-23 | Disposition: A | Discharge status: Home / Self Care | Payer: 59 | Source: Ambulatory Visit | Attending: Radiation Oncology | Admitting: Radiation Oncology

## 2020-08-23 DIAGNOSIS — Z510 Encounter for antineoplastic radiation therapy: Secondary | ICD-10-CM | POA: Diagnosis not present

## 2020-09-05 ENCOUNTER — Other Ambulatory Visit: Payer: Self-pay

## 2020-09-05 ENCOUNTER — Encounter: Payer: Self-pay | Admitting: Oncology

## 2020-09-05 ENCOUNTER — Inpatient Hospital Stay: Payer: 59 | Attending: Oncology | Admitting: Oncology

## 2020-09-05 VITALS — BP 113/76 | HR 76 | Temp 97.6°F | Wt 151.4 lb

## 2020-09-05 DIAGNOSIS — I10 Essential (primary) hypertension: Secondary | ICD-10-CM | POA: Diagnosis not present

## 2020-09-05 DIAGNOSIS — D0512 Intraductal carcinoma in situ of left breast: Secondary | ICD-10-CM | POA: Diagnosis present

## 2020-09-05 DIAGNOSIS — Z833 Family history of diabetes mellitus: Secondary | ICD-10-CM | POA: Insufficient documentation

## 2020-09-05 DIAGNOSIS — Z780 Asymptomatic menopausal state: Secondary | ICD-10-CM | POA: Diagnosis not present

## 2020-09-05 DIAGNOSIS — Z8249 Family history of ischemic heart disease and other diseases of the circulatory system: Secondary | ICD-10-CM | POA: Insufficient documentation

## 2020-09-05 DIAGNOSIS — Z79899 Other long term (current) drug therapy: Secondary | ICD-10-CM | POA: Insufficient documentation

## 2020-09-05 DIAGNOSIS — Z803 Family history of malignant neoplasm of breast: Secondary | ICD-10-CM | POA: Insufficient documentation

## 2020-09-05 DIAGNOSIS — Z800 Family history of malignant neoplasm of digestive organs: Secondary | ICD-10-CM | POA: Insufficient documentation
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
## 2020-09-10 ENCOUNTER — Ambulatory Visit
Admission: RE | Admit: 2020-09-10 | Discharge: 2020-09-10 | Disposition: A | Discharge status: Home / Self Care | Payer: 59 | Source: Ambulatory Visit | Attending: Oncology | Admitting: Oncology

## 2020-09-10 ENCOUNTER — Other Ambulatory Visit: Payer: Self-pay

## 2020-09-10 DIAGNOSIS — D0512 Intraductal carcinoma in situ of left breast: Secondary | ICD-10-CM | POA: Insufficient documentation

## 2020-09-17 ENCOUNTER — Telehealth: Payer: Self-pay

## 2020-09-17 NOTE — Telephone Encounter (Signed)
Spoke to patient and she would like to think about this before proceeding. She will call back or send mychart visit with decision.   Pt also stated that she does not have a dentist and hasn't seen one in a while. Notified her that she will  need to establish care with one to be evaluated, prior to starting zometa.

## 2020-09-17 NOTE — Telephone Encounter (Signed)
-----   Message from Earlie Server, MD sent at 09/15/2020 10:36 PM EDT ----- Let her know that her bone density test showed osteopenia which means bones are weaker, but not as bad as osteoporosis.  Considering family history of cardiovascular disease, I recommend Arimidex over tamoxifen. Arimidex may further reduce bone density, therefore I recommend to take calcium and vitamin d supplementation. I also recommend bone strengthen medication Zometa every 6 months. Recommend dental clearance.  If she agrees, please send her a Rx of Arimidex '1mg'$  daily, 30 days with 1 refill.  Schedule follow up 6 weeks, lab cbc cmp MD for evaluation of tolerability.

## 2020-09-27 NOTE — Telephone Encounter (Signed)
Left message for pt to return call and let us know whether she wants to start Arimidex or not.

## 2020-09-28 NOTE — Telephone Encounter (Signed)
Spoke to patient and she states that she has decided not to take anything at this time are there are "too many risks with her heart problem."   Did you want to schedule a follow up appt?

## 2020-10-01 NOTE — Telephone Encounter (Signed)
Please schedule patient for MD only in 3 months and notify her of appt. Thanks

## 2020-10-03 ENCOUNTER — Ambulatory Visit
Admission: RE | Admit: 2020-10-03 | Discharge: 2020-10-03 | Disposition: A | Discharge status: Home / Self Care | Payer: 59 | Source: Ambulatory Visit | Attending: Radiation Oncology | Admitting: Radiation Oncology

## 2020-10-03 ENCOUNTER — Encounter: Payer: Self-pay | Admitting: Radiation Oncology

## 2020-10-03 DIAGNOSIS — Z923 Personal history of irradiation: Secondary | ICD-10-CM | POA: Diagnosis not present

## 2020-10-03 DIAGNOSIS — D0512 Intraductal carcinoma in situ of left breast: Secondary | ICD-10-CM | POA: Insufficient documentation

## 2020-10-03 DIAGNOSIS — Z170 Estrogen receptor positive status [ER+]: Secondary | ICD-10-CM | POA: Insufficient documentation

## 2020-10-03 NOTE — Progress Notes (Signed)
Radiation Oncology Follow up Note  Name: Shelby Dunn   Date:   10/03/2020 MRN:  [id number] DOB: April 03, 1965    This 55 y.o. female presents to the clinic today for 1 month follow-up status post whole breast radiation to her left breast status post wide local excision for ER positive ductal carcinoma in situ.  REFERRING PROVIDER: Remi Haggard, FNP  HPI: Patient is a 55 year old female now out 1 month having completed whole breast radiation to her left breast for stage 0 ductal carcinoma in situ.  Tumor was ER positive.  She is elected not to proceed with antiestrogen therapy.  She specifically denies breast tenderness cough or bone pain..  COMPLICATIONS OF TREATMENT: none  FOLLOW UP COMPLIANCE: keeps appointments   PHYSICAL EXAM:  BP (!) (P) 103/55 (BP Location: Left Arm, Patient Position: Sitting)   Pulse (P) 78   Temp (P) 98.1 F (36.7 C) (Tympanic)   Resp (P) 18   Wt (P) 150 lb 8 oz (68.3 kg)   BMI (P) 28.44 kg/m  Lungs are clear to A&P cardiac examination essentially unremarkable with regular rate and rhythm. No dominant mass or nodularity is noted in either breast in 2 positions examined. Incision is well-healed. No axillary or supraclavicular adenopathy is appreciated. Cosmetic result is excellent.  Well-developed well-nourished patient in NAD. HEENT reveals PERLA, EOMI, discs not visualized.  Oral cavity is clear. No oral mucosal lesions are identified. Neck is clear without evidence of cervical or supraclavicular adenopathy. Lungs are clear to A&P. Cardiac examination is essentially unremarkable with regular rate and rhythm without murmur rub or thrill. Abdomen is benign with no organomegaly or masses noted. Motor sensory and DTR levels are equal and symmetric in the upper and lower extremities. Cranial nerves II through XII are grossly intact. Proprioception is intact. No peripheral adenopathy or edema is identified. No motor or sensory levels are noted. Crude visual fields  are within normal range.  RADIOLOGY RESULTS: No current films for review  PLAN: Present time patient is doing well with minimal side effects from recent whole breast radiation.  And pleased with her overall progress.  I have asked to see her back in 4 to 5 months for follow-up.  Patient knows to call with any concerns.  I would like to take this opportunity to thank you for allowing me to participate in the care of your patient.Noreene Filbert, MD

## 2020-10-31 ENCOUNTER — Other Ambulatory Visit: Payer: Self-pay

## 2020-10-31 DIAGNOSIS — D0512 Intraductal carcinoma in situ of left breast: Secondary | ICD-10-CM

## 2020-11-26 ENCOUNTER — Ambulatory Visit
Admission: RE | Admit: 2020-11-26 | Discharge: 2020-11-26 | Disposition: A | Discharge status: Home / Self Care | Payer: 59 | Source: Ambulatory Visit | Attending: Surgery | Admitting: Surgery

## 2020-11-26 ENCOUNTER — Other Ambulatory Visit: Payer: Self-pay

## 2020-11-26 DIAGNOSIS — D0512 Intraductal carcinoma in situ of left breast: Secondary | ICD-10-CM | POA: Diagnosis not present

## 2020-12-04 ENCOUNTER — Encounter: Payer: Self-pay | Admitting: Surgery

## 2020-12-04 ENCOUNTER — Other Ambulatory Visit: Payer: Self-pay

## 2020-12-04 ENCOUNTER — Ambulatory Visit (INDEPENDENT_AMBULATORY_CARE_PROVIDER_SITE_OTHER): Payer: 59 | Admitting: Surgery

## 2020-12-04 VITALS — BP 147/75 | HR 74 | Temp 98.3°F | Ht 63.0 in | Wt 151.6 lb

## 2020-12-04 DIAGNOSIS — Z86000 Personal history of in-situ neoplasm of breast: Secondary | ICD-10-CM

## 2020-12-04 DIAGNOSIS — D0512 Intraductal carcinoma in situ of left breast: Secondary | ICD-10-CM

## 2020-12-04 NOTE — Patient Instructions (Addendum)
We will contact you in April 2023 to schedule your mammogram and follow up with Dr.Rodenberg. If you do not hear from our office please call and let us know.

## 2020-12-04 NOTE — Progress Notes (Signed)
Surgical Clinic Progress/Follow-up Note   HPI:  55 y.o. Female presents to clinic for DCIS of left breast follow-up.  Over the last couple months she has felt increasing tightness in the left pectoral area, noted primarily with posterior extension of the left shoulder.   She is currently working out at [employer] and working on her range of motion on her own.  I have offered physical therapy referral if she was interested.  She is not. She notes there is some asymmetry now.  However, she never had axillary surgery, and her lumpectomy site is quite anterior and central.  Recent follow-up mammography is negative.  She had elected to avoid estrogen blockade due to potential side effects and her risk factors related to her cardiac history. Her next mammography will be in April.  Review of Systems:  Constitutional: denies fever/chills  ENT: denies sore throat, hearing problems  Respiratory: denies shortness of breath, wheezing  Cardiovascular: denies chest pain, palpitations  Gastrointestinal: denies abdominal pain, N/V, or diarrhea/and bowel function as per interval history Skin: Denies any other rashes or skin discolorations except post-surgical wounds as per interval history  Vital Signs:  BP (!) 147/75   Pulse 74   Temp 98.3 F (36.8 C) (Oral)   Ht 5\' 3"  (1.6 m)   Wt 151 lb 9.6 oz (68.8 kg)   SpO2 97%   BMI 26.85 kg/m    Physical Exam:  Constitutional:  -- Normal body habitus  -- Awake, alert, and oriented x3  Pulmonary:  -- No crackles -- Equal breath sounds bilaterally -- Breathing non-labored at rest Cardiovascular:  -- S1, S2 present  -- No pericardial rubs  Gastrointestinal:  -- Soft and non-distended, non-tender GU  --left breast with minimal lingering deformity in the area of her lumpectomy, without significant dimpling or puckering.  No suspicious, or dominant nodularity nor skin changes.  Postradiation changes mild to minimal.  There is a tightness of her left pectoral  area near her left shoulder. Musculoskeletal / Integumentary:  -- Wounds or skin discoloration: None appreciated except post-surgical incisions as described above  -- Extremities: B/L UE and LE FROM, hands and feet warm, no edema    Imaging:  CLINICAL DATA:  History of left breast cancer status post lumpectomy in May of 2022.   EXAM: DIGITAL DIAGNOSTIC UNILATERAL LEFT MAMMOGRAM WITH TOMOSYNTHESIS AND CAD   TECHNIQUE: Left digital diagnostic mammography and breast tomosynthesis was performed. The images were evaluated with computer-aided detection.   COMPARISON:  Previous exam(s).   ACR Breast Density Category c: The breast tissue is heterogeneously dense, which may obscure small masses.   FINDINGS: Lumpectomy changes are seen in the left breast. No suspicious mass or malignant type microcalcifications identified.   IMPRESSION: No evidence of malignancy in the left breast.   RECOMMENDATION: Bilateral diagnostic mammogram in April of 2023 is recommended.   I have discussed the findings and recommendations with the patient. If applicable, a reminder letter will be sent to the patient regarding the next appointment.   BI-RADS CATEGORY  2: Benign.     Electronically Signed   By: Lillia Mountain M.D.   On: 11/26/2020 11:27    Assessment:  55 y.o. yo Female with a problem list including...  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  Plan:              - return to clinic annually or as needed, instructed to call office if any questions or concerns   Anticipate her next imaging will be in 6 months.  All of the above recommendations were discussed with the patient, and all of patient's questions were answered to her expressed satisfaction.  These notes generated with voice recognition software. I apologize for typographical errors.  Ronny Bacon, MD, FACS [facility] exceptional care. Office: [phone]

## 2021-01-01 ENCOUNTER — Inpatient Hospital Stay: Payer: 59 | Attending: Oncology | Admitting: Oncology

## 2021-01-03 ENCOUNTER — Ambulatory Visit (INDEPENDENT_AMBULATORY_CARE_PROVIDER_SITE_OTHER): Payer: 59 | Admitting: Obstetrics and Gynecology

## 2021-01-03 ENCOUNTER — Encounter: Payer: Self-pay | Admitting: Obstetrics and Gynecology

## 2021-01-03 ENCOUNTER — Other Ambulatory Visit: Payer: Self-pay

## 2021-01-03 VITALS — BP 90/64 | Ht 61.0 in | Wt 153.0 lb

## 2021-01-03 DIAGNOSIS — Z1231 Encounter for screening mammogram for malignant neoplasm of breast: Secondary | ICD-10-CM

## 2021-01-03 DIAGNOSIS — Z01419 Encounter for gynecological examination (general) (routine) without abnormal findings: Secondary | ICD-10-CM | POA: Diagnosis not present

## 2021-01-03 DIAGNOSIS — Z803 Family history of malignant neoplasm of breast: Secondary | ICD-10-CM

## 2021-01-03 DIAGNOSIS — D0512 Intraductal carcinoma in situ of left breast: Secondary | ICD-10-CM | POA: Diagnosis not present
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
## 2021-01-03 NOTE — Patient Instructions (Signed)
I value your feedback and you entrusting us with your care. If you get a [facility] patient survey, I would appreciate you taking the time to let us know about your experience today. Thank you! ? ? ?

## 2021-03-06 ENCOUNTER — Ambulatory Visit: Payer: Self-pay | Admitting: Radiation Oncology

## 2021-04-08 ENCOUNTER — Inpatient Hospital Stay: Payer: Self-pay | Admitting: Oncology

## 2021-04-08 ENCOUNTER — Telehealth: Payer: Self-pay | Admitting: *Deleted

## 2021-04-08 NOTE — Telephone Encounter (Signed)
Patient needs to cancel today's appointment due to family emergency. ?

## 2021-04-16 ENCOUNTER — Other Ambulatory Visit: Payer: Self-pay

## 2021-04-16 DIAGNOSIS — D0512 Intraductal carcinoma in situ of left breast: Secondary | ICD-10-CM

## 2021-05-20 ENCOUNTER — Ambulatory Visit
Admission: RE | Admit: 2021-05-20 | Discharge: 2021-05-20 | Disposition: A | Discharge status: Home / Self Care | Payer: 59 | Source: Ambulatory Visit | Attending: Surgery | Admitting: Surgery

## 2021-05-20 DIAGNOSIS — Z853 Personal history of malignant neoplasm of breast: Secondary | ICD-10-CM | POA: Diagnosis not present

## 2021-05-20 DIAGNOSIS — D0512 Intraductal carcinoma in situ of left breast: Secondary | ICD-10-CM | POA: Diagnosis not present

## 2021-05-28 ENCOUNTER — Encounter: Payer: Self-pay | Admitting: Surgery

## 2021-05-28 ENCOUNTER — Ambulatory Visit (INDEPENDENT_AMBULATORY_CARE_PROVIDER_SITE_OTHER): Payer: 59 | Admitting: Surgery

## 2021-05-28 VITALS — BP 144/77 | HR 67 | Temp 98.2°F | Ht 61.0 in | Wt 148.2 lb

## 2021-05-28 DIAGNOSIS — Z86000 Personal history of in-situ neoplasm of breast: Secondary | ICD-10-CM | POA: Diagnosis not present

## 2021-05-28 DIAGNOSIS — R0789 Other chest pain: Secondary | ICD-10-CM

## 2021-05-28 DIAGNOSIS — D0512 Intraductal carcinoma in situ of left breast: Secondary | ICD-10-CM

## 2021-05-28 NOTE — Progress Notes (Signed)
Surgical Clinic Progress/Follow-up Note  ? ?HPI:  ?56 y.o. Female presents to clinic for DCIS of left breast follow-up.  She continues to have some discomfort in the left pectoral area, though this has improved she continues to pursue fitness.   ?Recent follow-up mammography is negative.    We utilized ultrasound to further evaluate her areas of concern.  She had elected to avoid estrogen blockade due to potential side effects and her risk factors related to her cardiac history. ? ? ?Review of Systems:  ?Constitutional: denies fever/chills  ?ENT: denies sore throat, hearing problems  ?Respiratory: denies shortness of breath, wheezing  ?Cardiovascular: denies chest pain, palpitations  ?Gastrointestinal: denies abdominal pain, N/V, or diarrhea/and bowel function as per interval history ?Skin: Denies any other rashes or skin discolorations except post-surgical wounds as per interval history ? ?Vital Signs:  ?BP (!) 144/77   Pulse 67   Temp 98.2 ?F (36.8 ?C) (Oral)   Ht '5\' 1"'$  (1.549 m)   Wt 148 lb 3.2 oz (67.2 kg)   SpO2 98%   BMI 28.00 kg/m?   ? ?Physical Exam:  ?Constitutional:  ?-- Normal body habitus  ?-- Awake, alert, and oriented x3  ?Pulmonary:  ?-- Breathing non-labored at rest ?Cardiovascular:  ?--Regular rate ?Gastrointestinal:  ?-- Soft and non-distended, non-tender ?GU  --left breast with minimal lingering deformity in the area of her central lateral lumpectomy, without significant dimpling or puckering.  No suspicious, or dominant nodularity nor skin changes.  Postradiation changes mild to minimal.  No appreciable left axillary masses or nodularity. ?Musculoskeletal / Integumentary:  ?-- Wounds or skin discoloration: None appreciated except post-surgical incisions as described above  ?-- Extremities: B/L UE and LE FROM, hands and feet warm, no edema  ? ? ?Imaging:  ?CLINICAL DATA:  Patient with history of left breast cancer status ?post lumpectomy. Patient also reports swelling within the left ?axilla  and tenderness within the left breast. ?  ?EXAM: ?DIGITAL DIAGNOSTIC BILATERAL MAMMOGRAM WITH TOMOSYNTHESIS AND CAD; ?ULTRASOUND LEFT BREAST LIMITED ?  ?TECHNIQUE: ?Bilateral digital diagnostic mammography and breast tomosynthesis ?was performed. The images were evaluated with computer-aided ?detection.; Targeted ultrasound examination of the left breast was ?performed. ?  ?COMPARISON:  Previous exam(s). ?  ?ACR Breast Density Category c: The breast tissue is heterogeneously ?dense, which may obscure small masses. ?  ?FINDINGS: ?Stable postlumpectomy changes left breast. No new masses, ?calcifications or nonsurgical distortion identified within either ?breast. ?  ?On physical exam, no left axillary mass is palpated. ?  ?Targeted ultrasound is performed, showing normal tissue within the ?left axilla. Additionally normal tissue is demonstrated within the ?left breast 12:30 o'clock at the site of focal tenderness. ?  ?IMPRESSION: ?Stable lumpectomy changes left breast. ?  ?No suspicious abnormality at the site of left breast tenderness or ?within the left axilla at the site of reported swelling. ?  ?RECOMMENDATION: ?Continued clinical evaluation for left axillary swelling and left ?breast tenderness. ?  ?Bilateral diagnostic mammography in 1 year. ?  ?I have discussed the findings and recommendations with the patient. ?If applicable, a reminder letter will be sent to the patient ?regarding the next appointment. ?  ?BI-RADS CATEGORY  2: Benign. ?  ?  ?Electronically Signed ?  By: Lovey Newcomer M.D. ?  On: 05/20/2021 10:58 ? ? ?Assessment:  ?56 y.o. yo Female with a problem list including...  ?Patient Active Problem List  ? Diagnosis Date Noted  ? Genetic testing 07/30/2020  ? Family history of breast cancer 07/17/2020  ?  Family history of colon cancer 07/17/2020  ? Goals of care, counseling/discussion 07/06/2020  ? Postmenopausal 07/06/2020  ? 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 Atypical ductal  hyperplasia of left breast 06/05/2020  ? Stomach irritation   ? Esophageal dysphagia   ? Special screening for malignant neoplasm of intestine   ? Polyp of sigmoid colon   ? Internal hemorrhoids   ? Diverticulosis of large intestine without diverticulitis   ?  ?presents to clinic for follow-up evaluation of left breast with history of DCIS, not taking hormonal blockade, having completed postoperative radiation therapy progressing well, with residual discomfort of the left axilla and pectoral area. ? ?Plan:  ?            - return to clinic annually or as needed, instructed to call office if any questions or concerns   ?Anticipate her next imaging will be in 12 months. ? ?All of the above recommendations were discussed with the patient, and all of patient's questions were answered to her expressed satisfaction. ? ?These notes generated with voice recognition software. I apologize for typographical errors. ? ?Ronny Bacon, MD, FACS ?[place]: [facility] ?General Surgery - [facility] exceptional care. ?Office: [phone] ? ?

## 2021-05-28 NOTE — Patient Instructions (Signed)
If you have any concerns or questions, please feel free to call our office.   Breast Self-Awareness Breast self-awareness is knowing how your breasts look and feel. You need to: Check your breasts on a regular basis. Tell your doctor about any changes. Become familiar with the look and feel of your breasts. This can help you catch a breast problem while it is still small and can be treated. You should do breast self-exams even if you have breast implants. What you need: A mirror. A well-lit room. A pillow or other soft object. How to do a breast self-exam Follow these steps to do a breast self-exam: Look for changes  Take off all the clothes above your waist. Stand in front of a mirror in a room with good lighting. Put your hands down at your sides. Compare your breasts in the mirror. Look for any difference between them, such as: A difference in shape. A difference in size. Wrinkles, dips, and bumps in one breast and not the other. Look at each breast for changes in the skin, such as: Redness. Scaly areas. Skin that has gotten thicker. Dimpling. Open sores (ulcers). Look for changes in your nipples, such as: Fluid coming out of a nipple. Fluid around a nipple. Bleeding. Dimpling. Redness. A nipple that looks pushed in (retracted), or that has changed position. Feel for changes Lie on your back. Feel each breast. To do this: Pick a breast to feel. Place a pillow under the shoulder closest to that breast. Put the arm closest to that breast behind your head. Feel the nipple area of that breast using the hand of your other arm. Feel the area with the pads of your three middle fingers by making small circles with your fingers. Use light, medium, and firm pressure. Continue the overlapping circles, moving downward over the breast. Keep making circles with your fingers. Stop when you feel your ribs. Start making circles with your fingers again, this time going upward until you  reach your collarbone. Then, make circles outward across your breast and into your armpit area. Squeeze your nipple. Check for discharge and lumps. Repeat these steps to check your other breast. Sit or stand in the tub or shower. With soapy water on your skin, feel each breast the same way you did when you were lying down. Write down what you find Writing down what you find can help you remember what to tell your doctor. Write down: What is normal for each breast. Any changes you find in each breast. These include: The kind of changes you find. A tender or painful breast. Any lump you find. Write down its size and where it is. When you last had your monthly period (menstrual cycle). General tips If you are breastfeeding, the best time to check your breasts is after you feed your baby or after you use a breast pump. If you get monthly bleeding, the best time to check your breasts is 5-7 days after your monthly cycle ends. With time, you will become comfortable with the self-exam. You will also start to know if there are changes in your breasts. Contact a doctor if: You see a change in the shape or size of your breasts or nipples. You see a change in the skin of your breast or nipples, such as red or scaly skin. You have fluid coming from your nipples that is not normal. You find a new lump or thick area. You have breast pain. You have any concerns about your   breast health. Summary Breast self-awareness includes looking for changes in your breasts and feeling for changes within your breasts. You should do breast self-awareness in front of a mirror in a well-lit room. If you get monthly periods (menstrual cycles), the best time to check your breasts is 5-7 days after your period ends. Tell your doctor about any changes you see in your breasts. Changes include changes in size, changes on the skin, painful or tender breasts, or fluid from your nipples that is not normal. This information is  not intended to replace advice given to you by your health care provider. Make sure you discuss any questions you have with your health care provider. Document Revised: 11/22/2020 Document Reviewed: 11/22/2020 Elsevier Patient Education  2023 [employer]  

## 2021-06-24 DIAGNOSIS — Z79899 Other long term (current) drug therapy: Secondary | ICD-10-CM | POA: Diagnosis not present

## 2021-07-03 DIAGNOSIS — M13862 Other specified arthritis, left knee: Secondary | ICD-10-CM | POA: Diagnosis not present

## 2021-07-03 DIAGNOSIS — M25562 Pain in left knee: Secondary | ICD-10-CM | POA: Diagnosis not present

## 2022-03-26 ENCOUNTER — Other Ambulatory Visit: Payer: Self-pay

## 2022-03-26 DIAGNOSIS — D0512 Intraductal carcinoma in situ of left breast: Secondary | ICD-10-CM

## 2022-05-23 ENCOUNTER — Ambulatory Visit
Admission: RE | Admit: 2022-05-23 | Discharge: 2022-05-23 | Disposition: A | Discharge status: Home / Self Care | Payer: 59 | Source: Ambulatory Visit | Attending: Surgery | Admitting: Surgery

## 2022-05-23 DIAGNOSIS — D0512 Intraductal carcinoma in situ of left breast: Secondary | ICD-10-CM | POA: Diagnosis present

## 2022-05-29 NOTE — Progress Notes (Deleted)
Surgical Clinic Progress/Follow-up Note   HPI:  57 y.o. Female presents to clinic for DCIS of left breast follow-up.  She continues to have some discomfort in the left pectoral area, though this has improved she continues to pursue fitness.   Recent follow-up mammography is negative.    We utilized ultrasound to further evaluate her areas of concern.  She had elected to avoid estrogen blockade due to potential side effects and her risk factors related to her cardiac history.   Review of Systems:  Constitutional: denies fever/chills  ENT: denies sore throat, hearing problems  Respiratory: denies shortness of breath, wheezing  Cardiovascular: denies chest pain, palpitations  Gastrointestinal: denies abdominal pain, N/V, or diarrhea/and bowel function as per interval history Skin: Denies any other rashes or skin discolorations except post-surgical wounds as per interval history  Vital Signs:  There were no vitals taken for this visit.   Physical Exam:  Constitutional:  -- Normal body habitus  -- Awake, alert, and oriented x3  Pulmonary:  -- Breathing non-labored at rest Cardiovascular:  --Regular rate Gastrointestinal:  -- Soft and non-distended, non-tender GU  --left breast with minimal lingering deformity in the area of her central lateral lumpectomy, without significant dimpling or puckering.  No suspicious, or dominant nodularity nor skin changes.  Postradiation changes mild to minimal.  No appreciable left axillary masses or nodularity. Musculoskeletal / Integumentary:  -- Wounds or skin discoloration: None appreciated except post-surgical incisions as described above  -- Extremities: B/L UE and LE FROM, hands and feet warm, no edema    Imaging:  CLINICAL DATA:  57 year old female presenting for routine annual surveillance status post left breast lumpectomy in May of 2022.   EXAM: DIGITAL DIAGNOSTIC BILATERAL MAMMOGRAM WITH TOMOSYNTHESIS   TECHNIQUE: Bilateral digital  diagnostic mammography and breast tomosynthesis was performed.   COMPARISON:  Previous exam(s).   ACR Breast Density Category c: The breasts are heterogeneously dense, which may obscure small masses.   FINDINGS: The surgical changes in the lateral aspect of the left breast are stable. An asymmetry was seen on the initial CC view of the left breast. Spot compression tomosynthesis images through the medial aspect of the left breast demonstrates resolution of this asymmetry. No suspicious calcifications, masses or areas of distortion are seen in the bilateral breasts.   IMPRESSION: Stable left breast lumpectomy site. No mammographic evidence of malignancy in the bilateral breasts.   RECOMMENDATION: Per protocol, as the patient is now 2 or more years status post lumpectomy, she may return to annual screening mammography in 1 year. However, given the history of breast cancer, the patient remains eligible for annual diagnostic mammography if preferred.   I have discussed the findings and recommendations with the patient. If applicable, a reminder letter will be sent to the patient regarding the next appointment.   BI-RADS CATEGORY  2: Benign.    Electronically Signed   By: Frederico Hamman M.D.   On: 05/23/2022 11:49   Assessment:  57 y.o. yo Female with a problem list including...  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  Plan:              - return to clinic annually or as needed, instructed to call office if any questions or concerns   Anticipate her next imaging will be in 12 months.  All of the above recommendations were discussed with the patient, and all of patient's questions were answered to her expressed satisfaction.  These notes generated with voice recognition software. I apologize for typographical errors.  Campbell Lerner, MD, FACS [place]: [facility] - Partnering for exceptional care. Office: [phone]

## 2022-06-03 ENCOUNTER — Ambulatory Visit: Payer: 59 | Admitting: Surgery

## 2022-08-03 IMAGING — MG DIGITAL DIAGNOSTIC BILAT W/ TOMO W/ CAD
8 series · 8 of 24 positions shown · non-contrast
Comparison: Previous exam(s).

CLINICAL DATA: Here for follow-up of a mass in the left breast as
well as annual bilateral mammogram.

EXAM:
DIGITAL DIAGNOSTIC BILATERAL MAMMOGRAM WITH TOMOSYNTHESIS AND CAD;
ULTRASOUND LEFT BREAST LIMITED
TECHNIQUE: Bilateral digital diagnostic mammography and breast tomosynthesis
was performed. The images were evaluated with computer-aided
detection.; Targeted ultrasound examination of the left breast was
performed

[L MLO synth-2D]
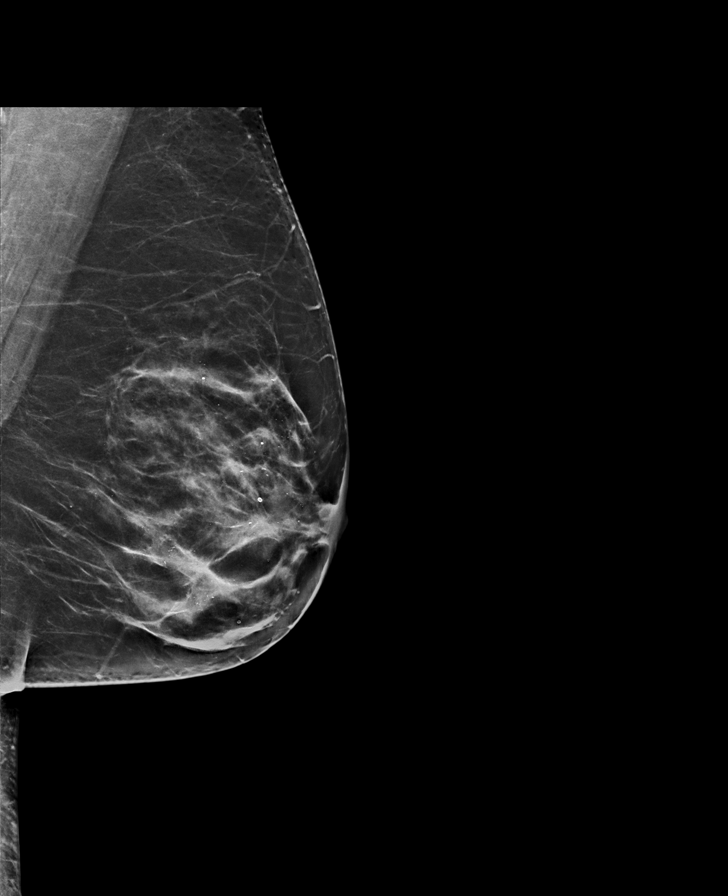

[R MLO synth-2D]
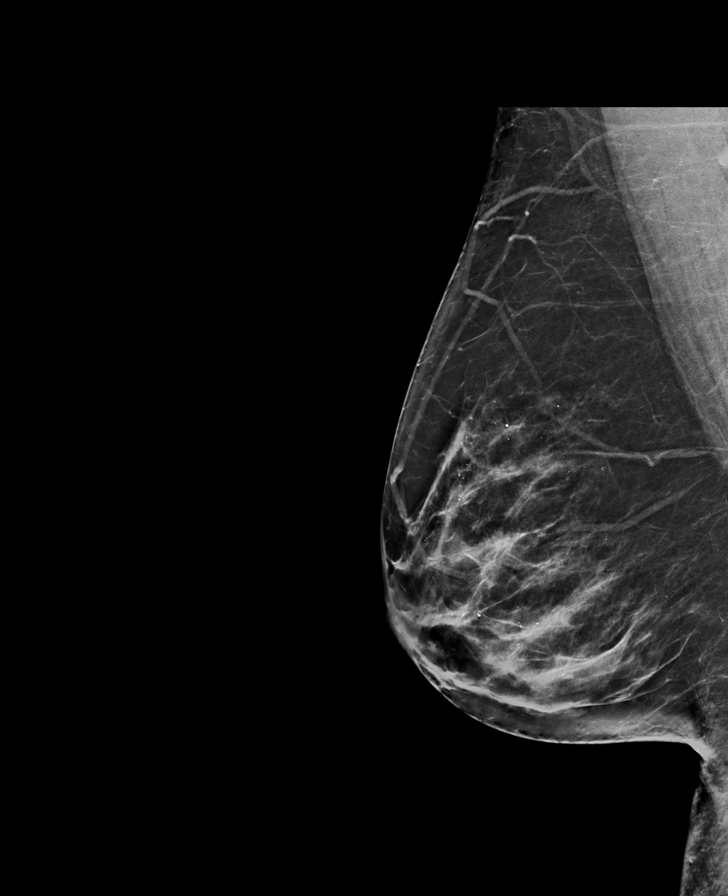

[R CC synth-2D]
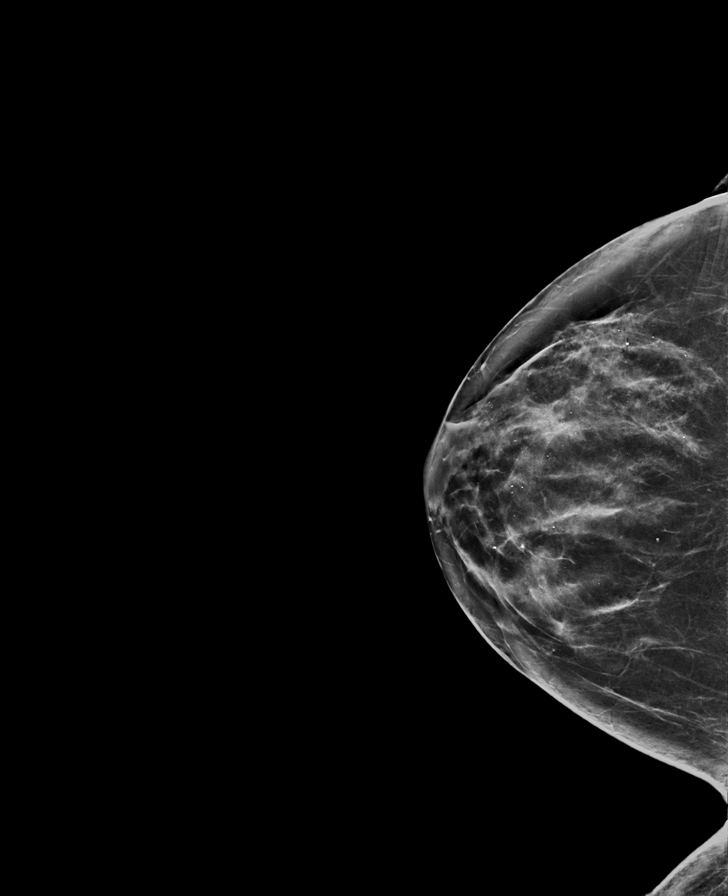

[L CC synth-2D]
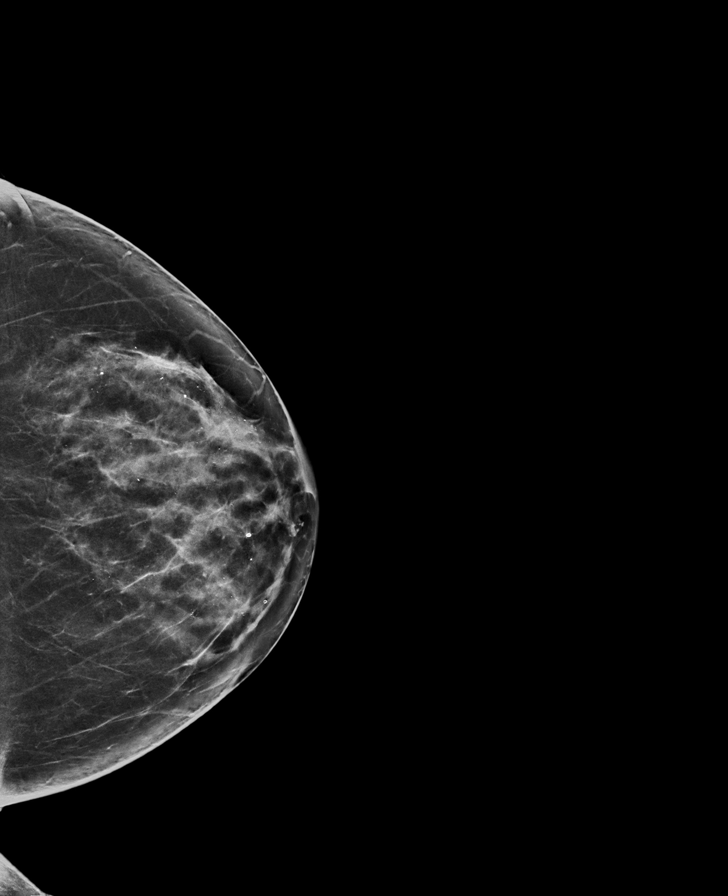

[R MLO tomo · tomo slice 37/73.0]
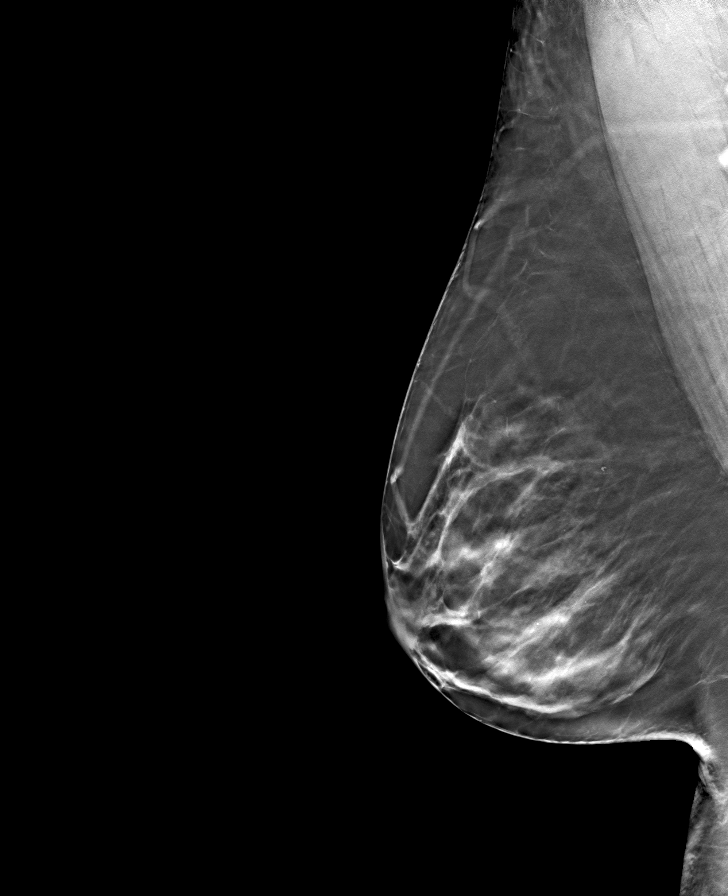

[L MLO tomo · tomo slice 37/73.0]
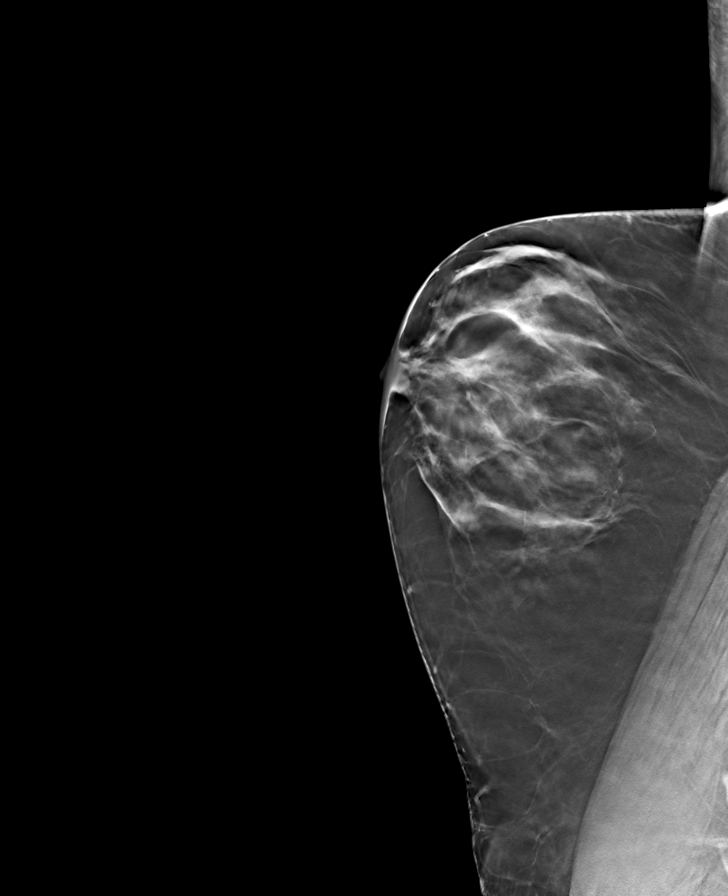

[R CC tomo · tomo slice 39/77.0]
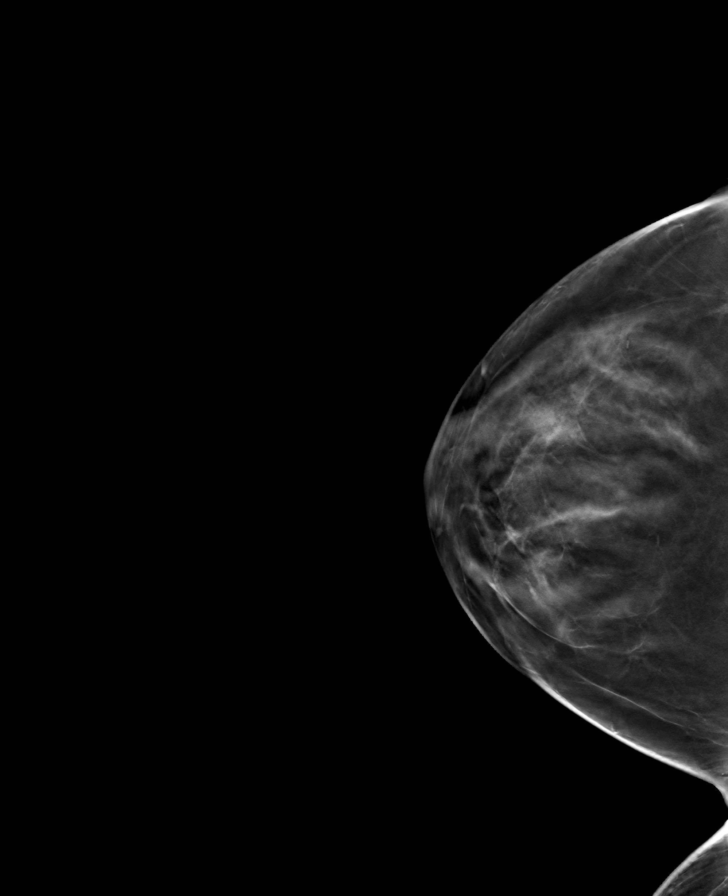

[L CC tomo · tomo slice 37/74.0]
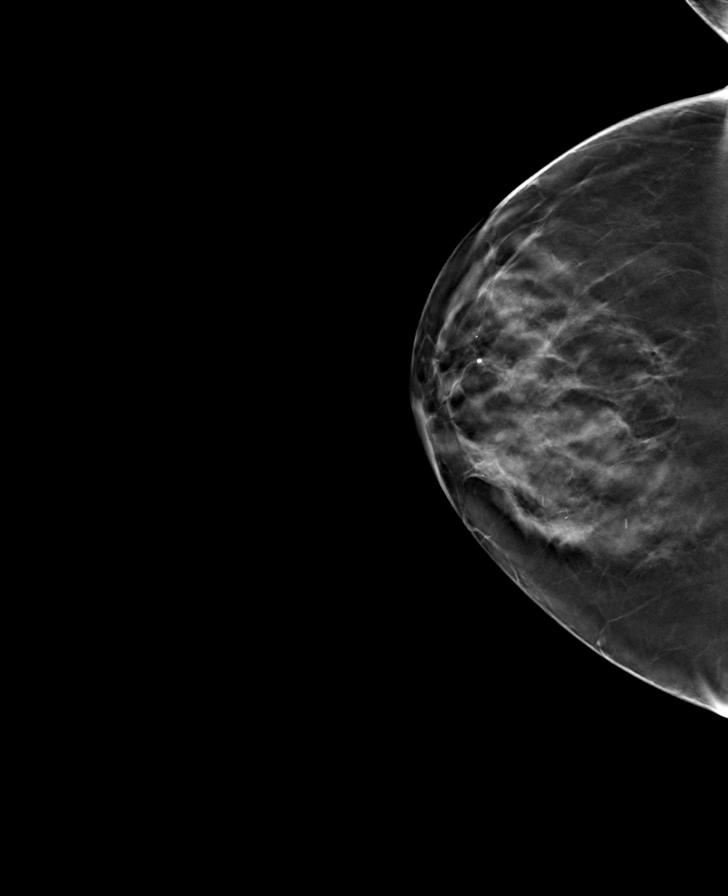

[8 of 24 positions shown; findings below may reference images not displayed]

ACR Breast Density Category c: The breast tissue is heterogeneously
dense, which may obscure small masses.
FINDINGS: An 8 mm mass in the upper outer left breast does not appear
significantly changed since 05/17/2019.

No suspicious mass, microcalcification, or other finding is
identified in the right breast.

Targeted left breast ultrasound was performed.

At 2 o'clock 3 cm from the nipple an oval hypoechoic mass measures 9
x 5 x 5 mm and appears to be located within a milk duct. This is
favored to correspond to the mass seen on the mammogram.

No abnormally enlarged left axillary lymph node is identified.
IMPRESSION: 1. Hypoechoic mass in the left breast appears to be located within a
milk duct and is suspicious for malignancy.

2.  No evidence of malignancy in the right breast.

RECOMMENDATION:
Recommend ultrasound-guided left breast biopsy. If the biopsied mass
does not correspond to the mammographic mass, then follow-up of the
mammographic mass would be recommended in 12 months to document 2
years of stability.

I have discussed the findings and recommendations with the patient.
If applicable, a reminder letter will be sent to the patient
regarding the next appointment.

BI-RADS CATEGORY  4: Suspicious.

## 2022-09-03 IMAGING — MG MM BREAST SURGICAL SPECIMEN
1 series · 1 of 1 positions shown · non-contrast
Comparison: Previous exam(s).

CLINICAL DATA: Status post Scout localized LEFT breast lumpectomy.

EXAM:
SPECIMEN RADIOGRAPH OF THE LEFT BREAST

[L]
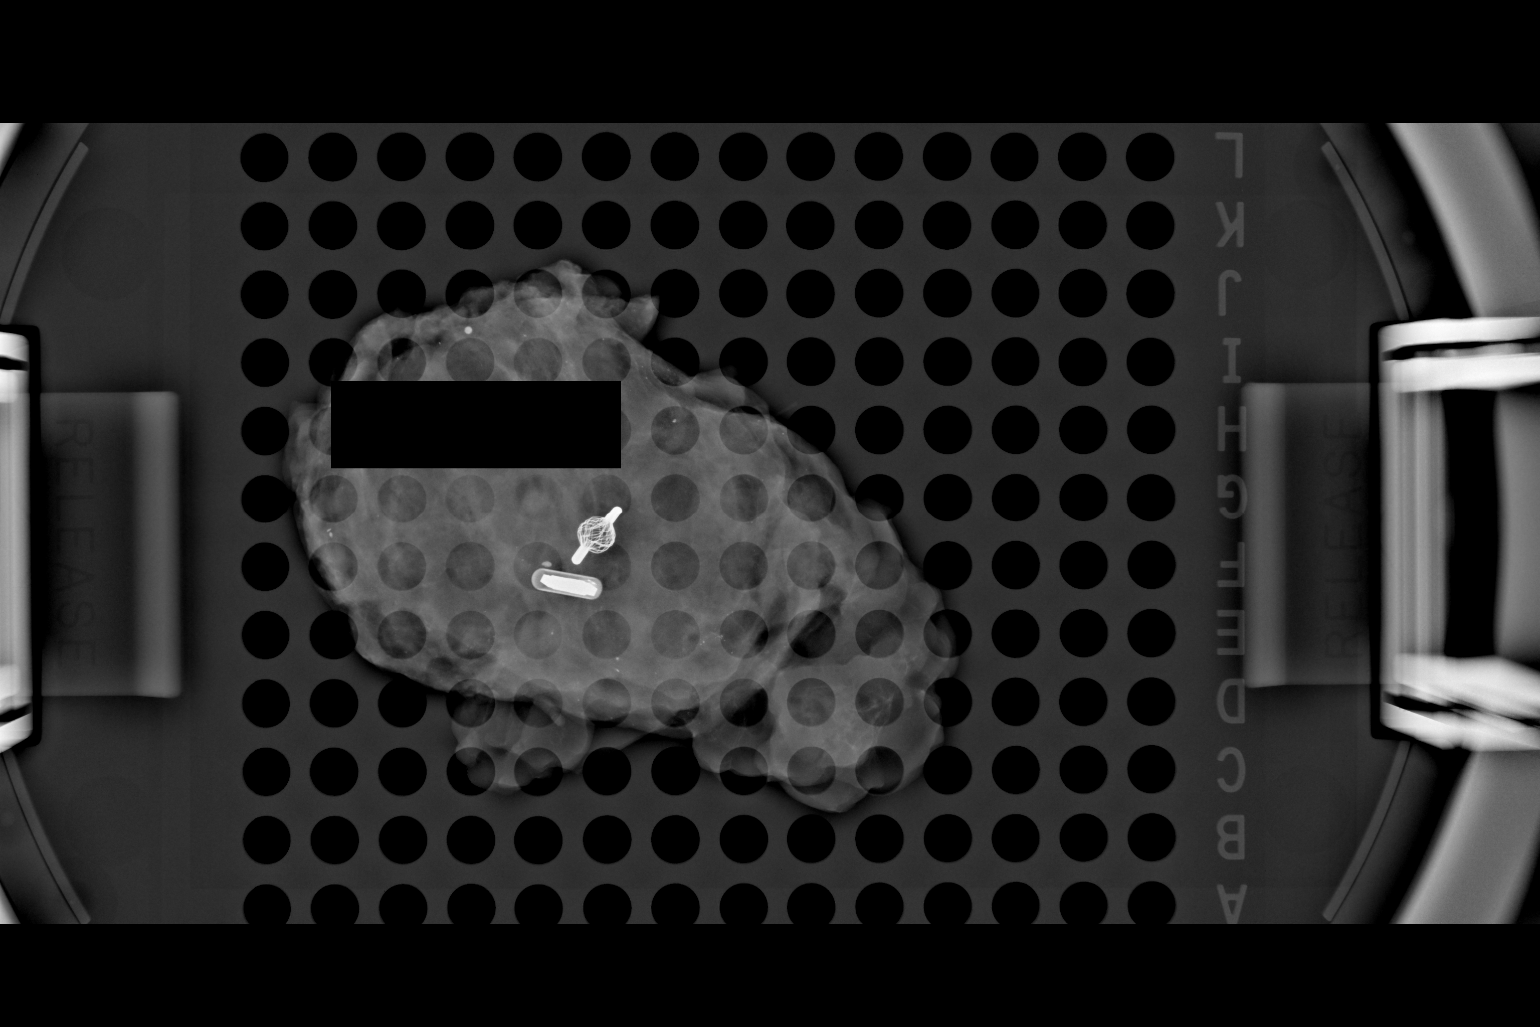

[1 of 1 positions shown; findings below may reference images not displayed]

FINDINGS: Status post excision of the left breast. The Scout reflector and
biopsy clip are present within the specimen.
IMPRESSION: Specimen radiograph of the left breast.

## 2022-10-28 ENCOUNTER — Encounter: Payer: Self-pay | Admitting: Adult Health

## 2022-10-28 ENCOUNTER — Other Ambulatory Visit: Payer: Self-pay | Admitting: Adult Health

## 2022-10-28 DIAGNOSIS — R140 Abdominal distension (gaseous): Secondary | ICD-10-CM

## 2022-10-28 DIAGNOSIS — R0602 Shortness of breath: Secondary | ICD-10-CM

## 2023-09-28 ENCOUNTER — Ambulatory Visit: Payer: Self-pay | Admitting: Family Medicine
